# Patient Record
Sex: Female | Born: 1935 | Race: White | Hispanic: No
Health system: Midwestern US, Community
[De-identification: ages and names within clinical notes are randomized; demographics above are authoritative.]

---

## 2019-06-29 IMAGING — MR MRI LSPINE WO/W CONTRAST
9 series · 42 of 48 positions shown · IV contrast (prohance)
Comparison: None.

HISTORY: Low back pain.
TECHNIQUE: Multiplanar, multisequential MR images of the lumbar spine were obtained before and after administration of 15 mL of ProHance intravenous contrast.

[Series 11: iii_aaspine_lspine_mpr_cor · coronal · 1.7mm · 1.67mm/px · 8 of 80 slices shown]
[im 1/80]
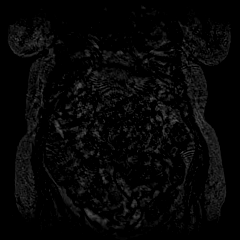
[im 13/80]
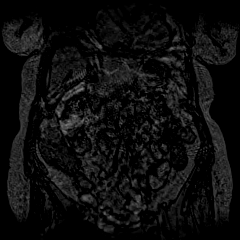
[im 25/80]
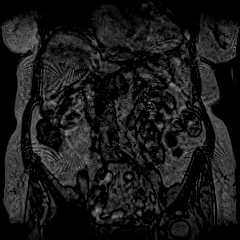
[im 37/80]
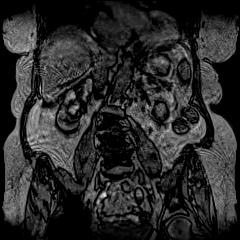
[im 43/80]
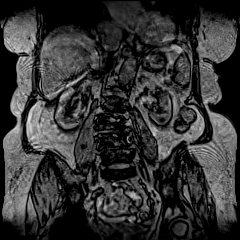
[im 55/80]
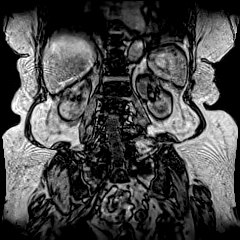
[im 67/80]
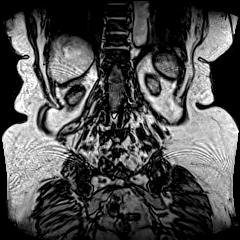
[im 80/80]
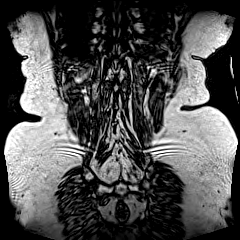

[Series 18: t2_sag · sagittal · 4.0mm · 0.68mm/px · 3 of 15 slices shown]
[im 1/15]
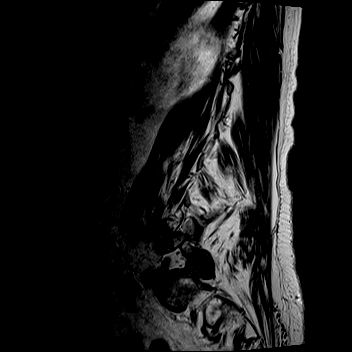
[im 8/15]
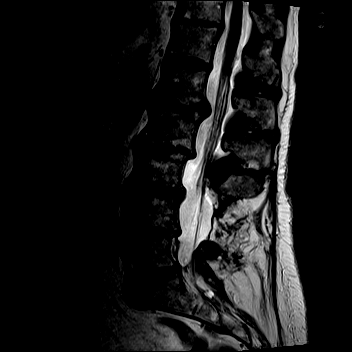
[im 15/15]
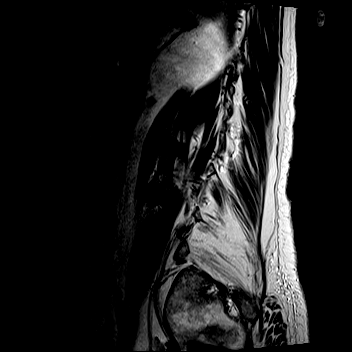

[Series 19: t1_sag · sagittal · 4.0mm · 0.75mm/px · 2 of 15 slices shown]
[im 1/15]
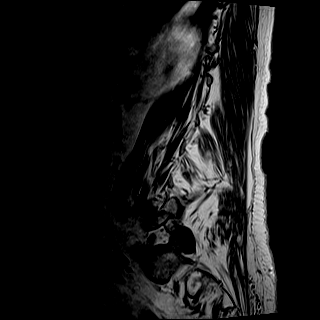
[im 15/15]
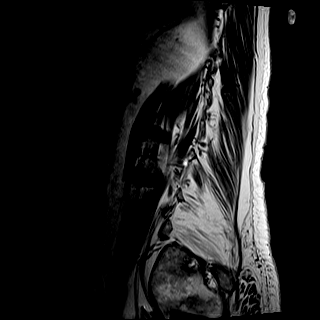

[Series 20: ir_sag · sagittal · 4.0mm · 0.94mm/px · 2 of 15 slices shown]
[im 1/15]
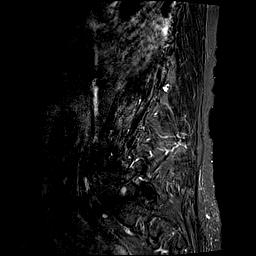
[im 15/15]
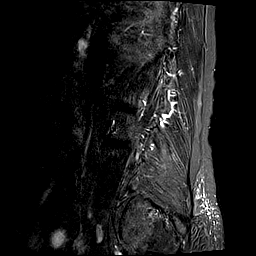

[Series 21: t2_axial · axial · 4.0mm · 0.62mm/px · z∈[-526,-329]mm · 7 of 42 slices shown]
[im 1/42]
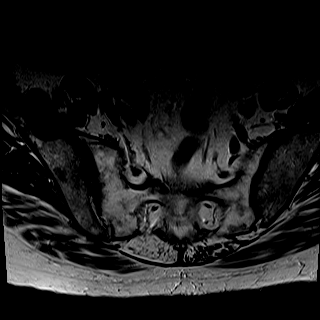
[im 7/42]
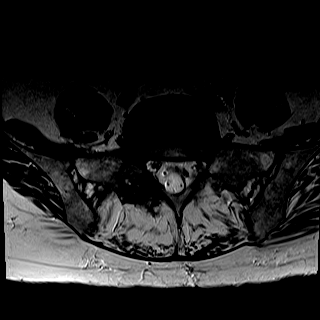
[im 14/42]
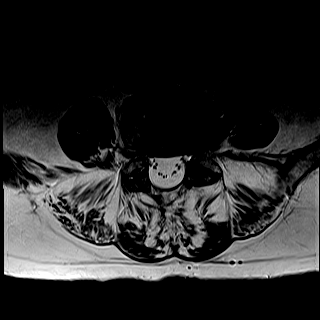
[im 21/42]
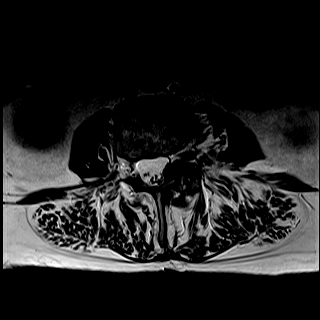
[im 28/42]
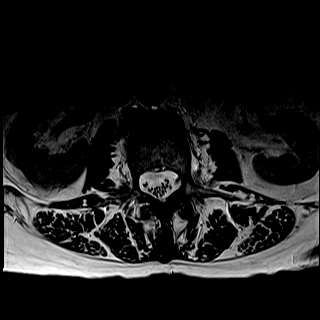
[im 35/42]
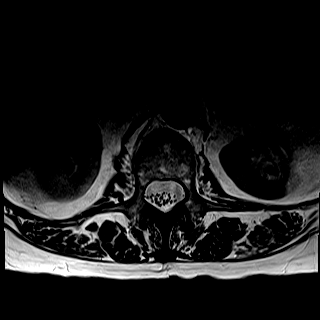
[im 42/42]
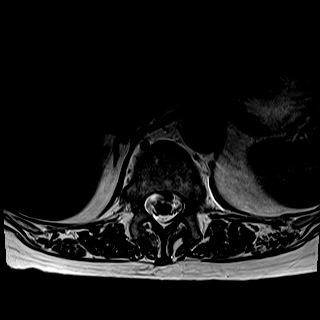

[Series 22: t1_axial_obl · axial · 3.0mm · 0.43mm/px · z∈[-556,-350]mm · 4 of 26 slices shown]
[im 1/26]
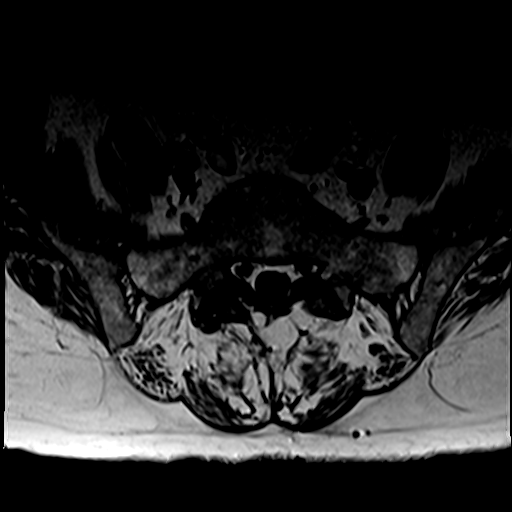
[im 9/26]
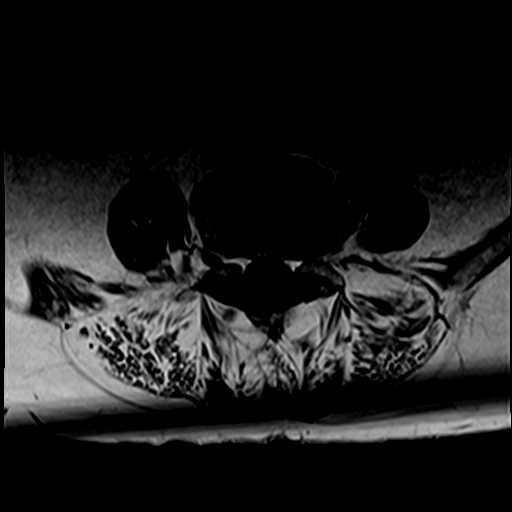
[im 17/26]
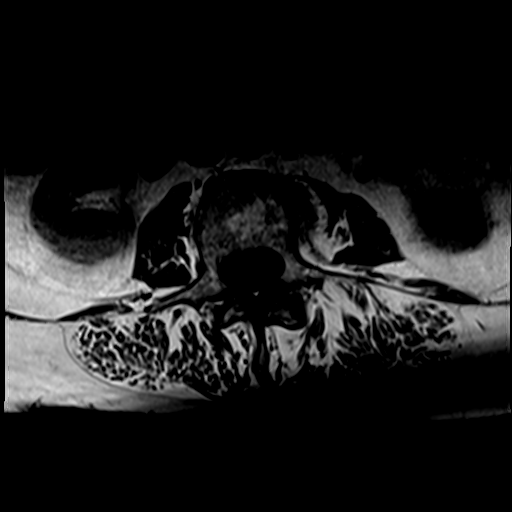
[im 26/26]
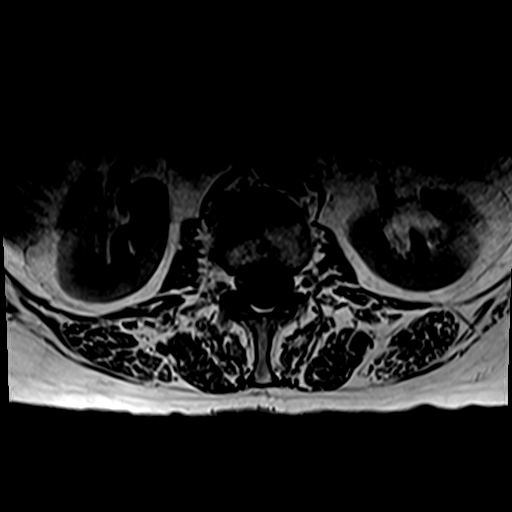

[Series 23: t1_axial_fs_pre · axial · 4.0mm · 0.78mm/px · z∈[-515,-328]mm · 7 of 40 slices shown]
[im 1/40]
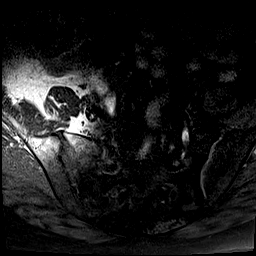
[im 7/40]
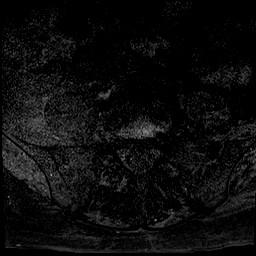
[im 14/40]
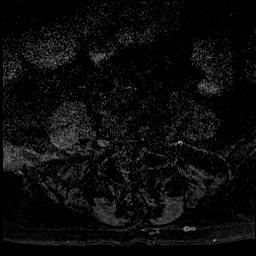
[im 20/40]
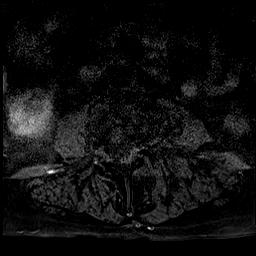
[im 27/40]
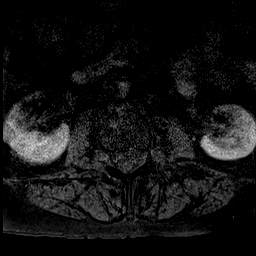
[im 33/40]
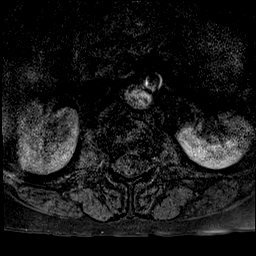
[im 40/40]
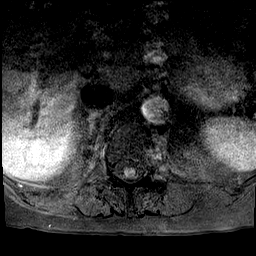

[Series 24: t1_axial_fs+c · axial · 4.0mm · 0.78mm/px · z∈[-515,-328]mm · 7 of 40 slices shown]
[im 1/40]
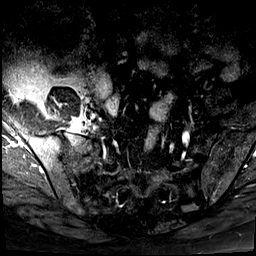
[im 7/40]
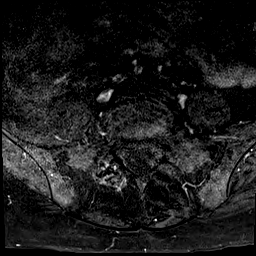
[im 14/40]
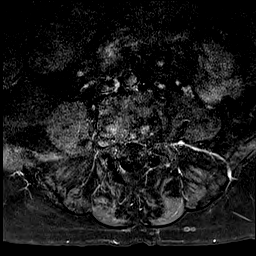
[im 20/40]
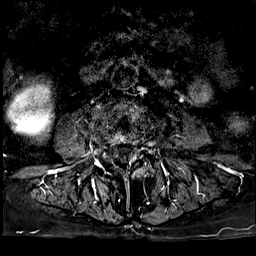
[im 27/40]
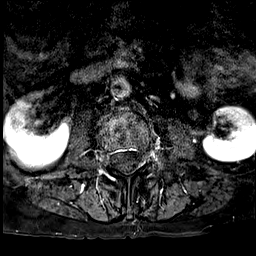
[im 33/40]
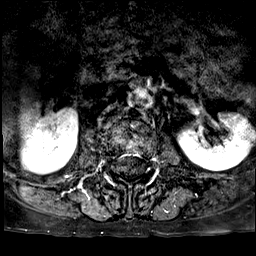
[im 40/40]
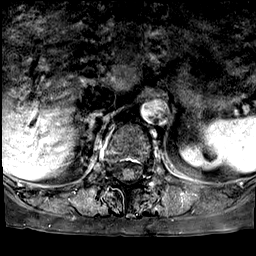

[Series 25: t1_sag_fs_+c · sagittal · 4.0mm · 0.94mm/px · 2 of 15 slices shown]
[im 1/15]
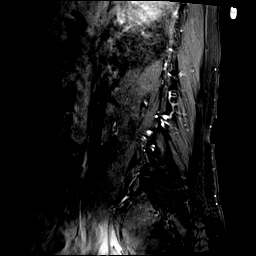
[im 15/15]
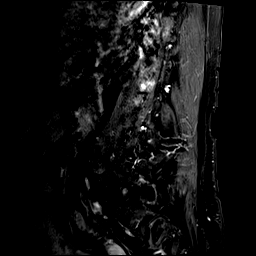

[42 of 48 positions shown; findings below may reference images not displayed]

FINDINGS: Five non-rib-bearing lumbar vertebrae are seen.  Curvature of lumbar spine to the right with apex at L3 is seen.  Mild to large endplate spurs of lumbar spine are seen. Degenerative endplate changes are seen. Endplate Schmorl's nodes are seen. Mild grade 1 retrolisthesis of L2 relative to L1 and L3 by 2 mm is seen. Previous L4 laminectomy is seen. No acute fracture is seen.

Decreased disc T2 signal of lumbar spine and lower thoracic spine with maintenance of disc height at the T12-L1 level and L5-S1 level seen. Moderate to severe disc space narrowing involving the rest of the lumbar spine and T11-T12 disc space is seen.

Conus medullaris terminates at the L1 level, and demonstrates normal signal. There is no abnormal thickening of the cauda equina nerve roots.

Enhancement due to degenerative endplate changes seen.

At T11-T12, disc bulge with endplate spur formation, as well as superimposed large central left paracentral disc extrusion with caudal migrating component identified. Mild degenerative facet disease and mild ligamentum flavum hypertrophy also seen. There is overall mild thecal sac compression and mild spinal canal stenosis. Neural foramina are patent.

At T12-L1, there is broad-based disc bulge, moderate degenerative facet disease with moderate ligamentum flavum hypertrophy, producing overall mild thecal sac compression and mild spinal canal stenosis. Mild left T12 neural foraminal stenosis due to degenerative facet disease identified. Right T12 neural foramen is patent.

At L1-L2, there is broad-based disc bulge with endplate spur formation, moderate degenerative facet disease and mild ligamentum flavum hypertrophy, producing overall mild to moderate thecal sac compression and mild to moderate spinal canal stenosis with also mild right L1 neural foraminal stenosis. Left L1 neural foramen is patent.

At L2-L3, there is broad-based disc bulge with endplate spur formation, moderate degenerative facet disease and moderate ligamentum flavum hypertrophy, producing overall mild to moderate thecal sac compression and mild to moderate spinal canal stenosis. Mild to moderate left L2 and mild right L2 neural foraminal stenosis identified.

At L3-L4, endplate spur formation identified. There is previous laminectomy with ligamentum flavum resection with minimal thecal sac compression on the left due to degenerative facet disease of moderate degree identified without spinal canal stenosis. Moderate left L3 neural foraminal stenosis due to endplate spur formation and degenerative facet disease identified. Right L3 neural foramen is patent. Enhancing granulation tissue adjacent to thecal sac and likely adjacent to the exiting L3 nerves identified.

At L4-L5, previous laminectomy with ligamentum flavum resection identified without disc herniation or spinal canal stenosis. Enhancing granulation tissue adjacent to thecal sac and adjacent to the exiting L4 nerve is seen. Moderate to severe left L4 and moderate right L4 neural foraminal stenosis due to endplate spur formation and degenerative facet disease identified.

At L5-S1, moderate degenerative facet disease on the right with mild degenerative facet disease on the left identified without disc herniation or spinal canal stenosis. Mild right L5 neural foraminal stenosis identified. Left L5 neural foramen is patent. Enhancing granulation tissue adjacent to the exiting L5 nerve is seen.

Likely Tarlov cyst at S2 level up to 17 mm is seen.

Degenerative changes of SI joints seen. Simple cyst at superior pole of left kidney is seen.
IMPRESSION: Degenerative changes and postsurgical changes of lumbar spine with curvature to the right with apex at L3 is seen.

Grade 1 retrolisthesis of L2 relative to L1 and L3 secondary to degenerative facet disease identified.

Bilateral mild to severe neural foraminal stenosis identified, most prominent involving left L4 neural foramen.

Mild to moderate spinal canal stenosis identified, most prominent at the L1-L2 level and L2-L3 level.

Enhancing granulation tissue adjacent to the thecal sac and adjacent to the bilateral exiting L3-L5 nerves identified.

No acute fracture is seen.

## 2019-10-28 IMAGING — CT CTA NECK WO-W CONTRAST
2 of 5 series · 11 of 33 positions shown · non-contrast
Comparison: None.

HISTORY: Carotid artery disease. Stent placement 07/06/2019.
TECHNIQUE: 5 mm axial images of CT neck study performed without contrast. After administration of 60 mL Dsovue-T33 IV contrast, 2 mm axial CT angiogram neck images are obtained. Coronal, sagittal, curved MPR and 3-D CT angiogram neck reformation images are obtained. Post-processing software generated rotating 3D MIP images, under concurrent physician supervision.

[Series 7: angio · axial · 0.36mm/px · z∈[-1078,-790]mm · 10 of 352 slices shown]
[im 32/352  soft-tissue]
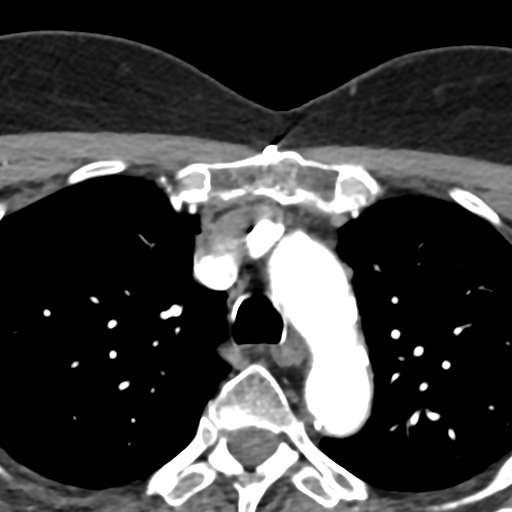
[im 64/352  bone]
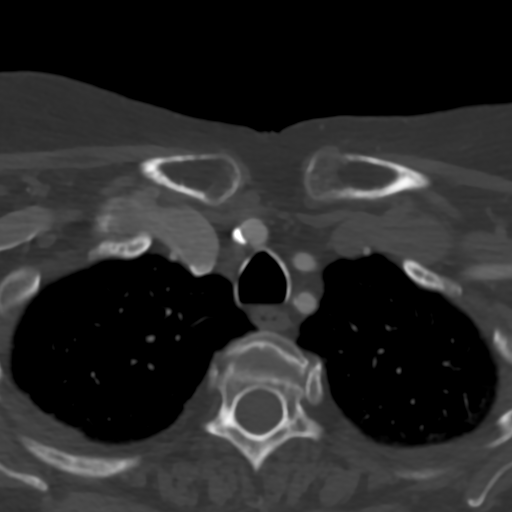
[im 96/352  soft-tissue]
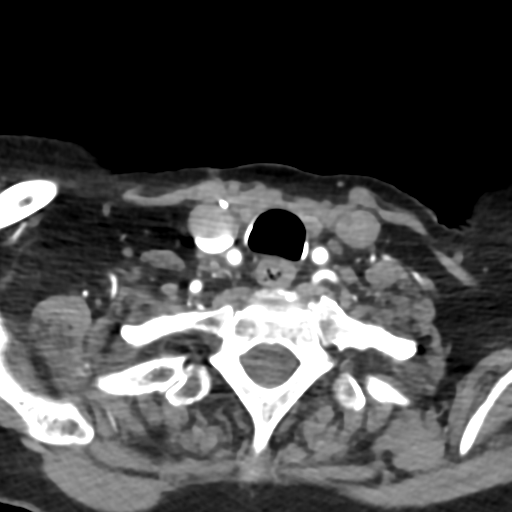
[im 128/352  bone]
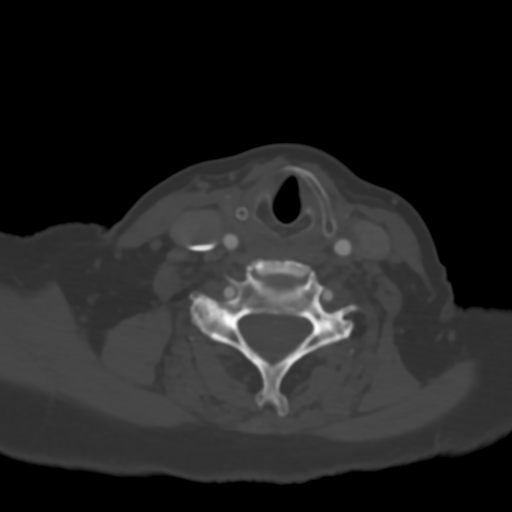
[im 160/352  soft-tissue]
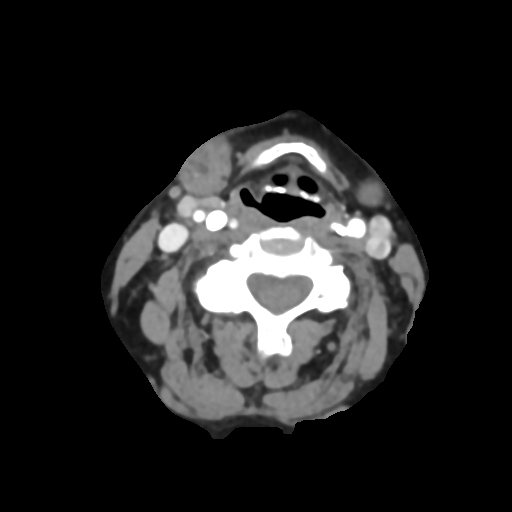
[im 192/352  bone]
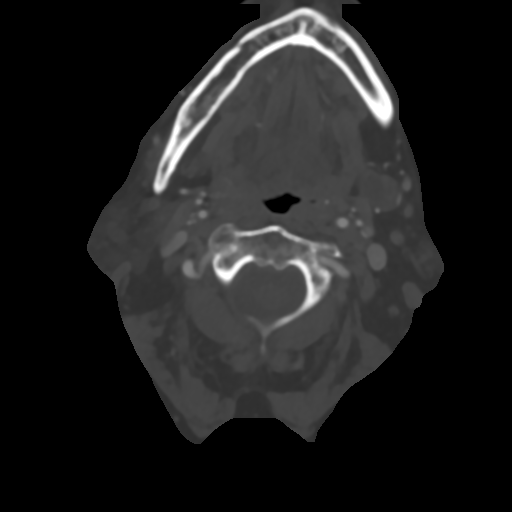
[im 224/352  soft-tissue]
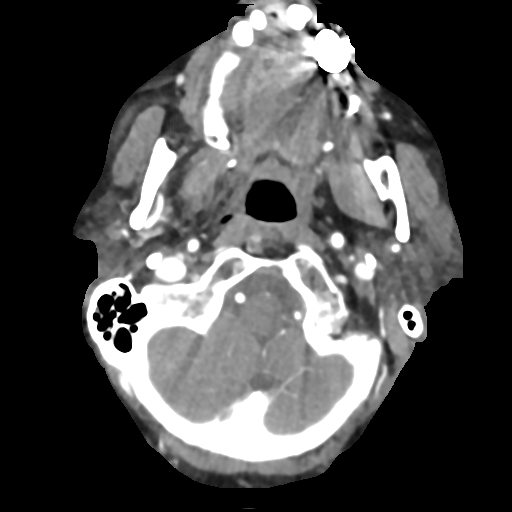
[im 256/352  bone]
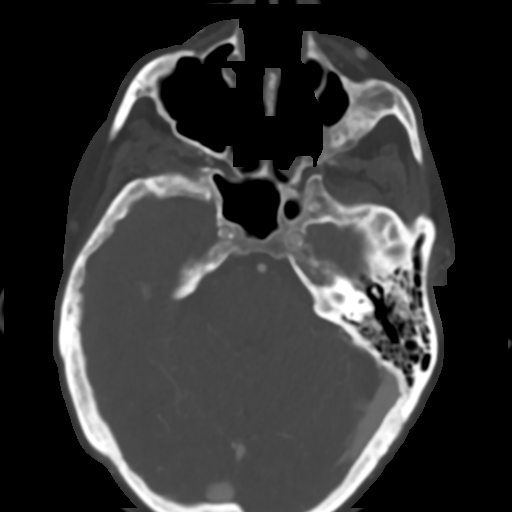
[im 288/352  soft-tissue]
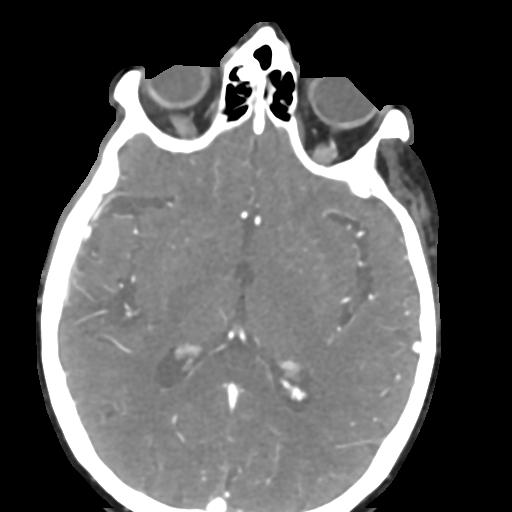
[im 320/352  bone]
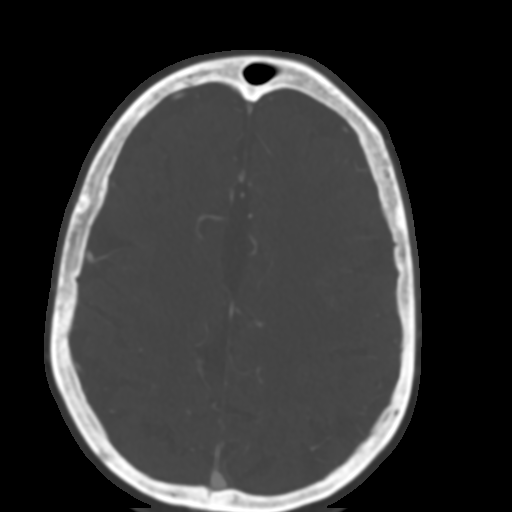

[Series 11: sagittal · sagittal · 0.33mm/px · 1 of 85 slices shown]
[im 43/85  soft-tissue]
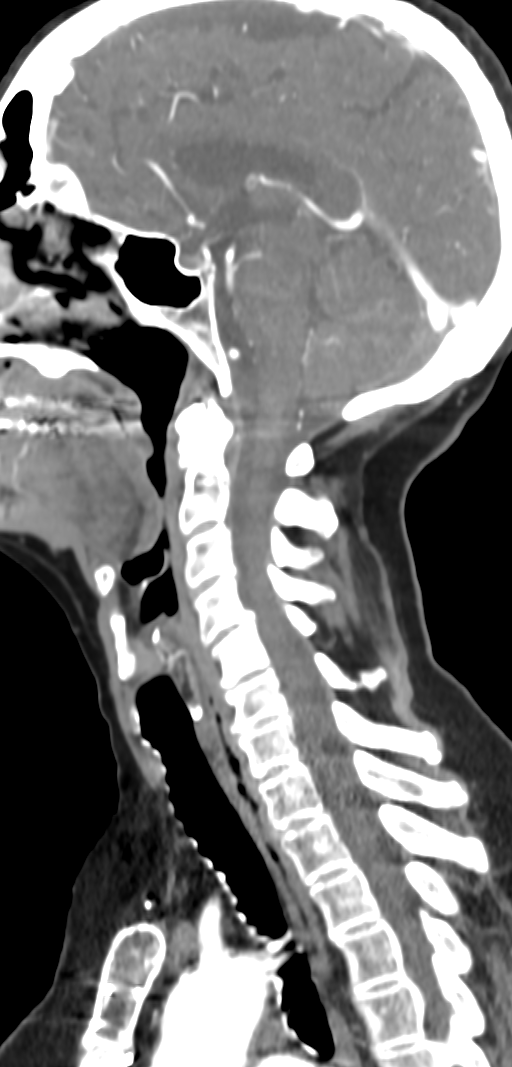

[11 of 33 positions shown; findings below may reference images not displayed]

FINDINGS: Calcified plaques of aortic arch are identified without aneurysm or dissection. Calcified plaques of brachiocephalic artery are identified producing 1 cm segment of narrowing of approximately 30% proximally. Calcified plaques of right subclavian artery identified producing narrowing of 20-30% proximally in middle portion. Distal right subclavian artery is normal caliber. Calcified plaques at proximal left subclavian artery identified producing narrowing of 20% identified without severe stenosis.

Right common carotid artery is of normal caliber. Stent placement from distal right common carotid through the right carotid bulb into proximal right internal carotid artery identified with 1 cm segment of 40-50% narrowing at the proximal right internal carotid artery identified. Right internal carotid artery shows tortuous course without other focal severe stenosis at cervical portion. Right external carotid artery is patent with suspected moderate narrowing at origin due to the stent placement. Calcified plaques of right carotid bulb identified.

Calcified plaques at origin of left common carotid artery with 5 mm segment of 30% narrowing identified without severe stenosis. Tortuosity of left common carotid artery identified without focal severe stenosis. Calcified plaques of left carotid bulb identified producing narrowing 30% identified without severe stenosis. Calcified plaques of proximal left internal carotid artery with short segment 30% narrowing also identified without severe stenosis. Tortuosity of left internal carotid artery identified without other focal severe stenosis at cervical portion. 2-3 mm segment of up to 75% stenosis at origin of left external carotid artery identified.

Tortuosity of proximal right vertebral artery identified without focal severe stenosis of right vertebral artery seen. Tortuosity at proximal left vertebral artery identified without focal severe stenosis of left vertebral artery identified.

Intrathecal vertebral arteries are patent. Distal left intrathecal vertebral artery is small in caliber relative to right. Basilar artery is normal caliber. Superior cerebellar arteries and posterior cerebral arteries are patent.

Bilateral petrous internal carotid arteries are patent. Calcified plaques involving bilateral cavernous and supraclinoid internal carotid arteries identified with tandem stenosis of 20-40% identified without focal severe stenosis.

Anterior communicating artery is patent. Right A1 segment is small in caliber relative to the left A1 segment. A2 segments are patent.

M1 and M2 branches of middle cerebral arteries are patent.

No aneurysm or arteriovenous malformation identified.

There is prominence of ventricles and cortical sulci. Periventricular and subcortical hypoattenuation foci are seen. Cervical spinal cord is grossly unremarkable.

Ciliary body calcifications of orbits identified. Previous bilateral cataract surgery is identified.

Mucosal thickening of ethmoid air cells, sphenoid sinuses and maxillary sinus with areas of wall sclerosis, most prominent involving left maxillary sinus is seen. Likely postsurgical changes of right maxillary sinus and right-sided ethmoid air cells are seen.

Mastoid air cells, middle ears and external auditory canals are normal.

Torus mandibularis identified. Streaky artifact from dental work is identified. Tongue and esophagus are grossly unremarkable.

Small calcification involving left-sided palatine tonsil is seen.

Nasopharynx, oropharynx, larynx and trachea are normal. Centrilobular-type of emphysematous changes identified. Dependent changes of upper lobes identified bilaterally. Questionable nodular subsegmental atelectasis of right lower lobe superior segment posteriorly up to 5 mm seen on image 7-3.

Noncalcified nodules involving superficial portion of parotid glands bilaterally with tubular appearance. Heterogeneous enhancement of submandibular glands with nodular areas identified. Thyroid gland is normal.

No other enlarged lymph node is identified.

Degenerative changes of cervical spine with mild grade 1 anterior spondylolisthesis of C4 relative to C3 and C5 by approximately 2 mm is seen.

Coronal, sagittal, curved MPR and 3-D CT angiogram neck reformation images confirm above findings.
IMPRESSION: Previous stent placement extending from distal right common carotid artery through the right carotid bulb into proximal right internal carotid artery with focal mild to moderate narrowing at the origin of right internal carotid artery identified.

Focal severe stenosis at origin of of left external carotid artery is seen.

Likely focal moderate narrowing at origin of right external carotid artery also identified.

Additional mild to moderate narrowing involving bilateral cavernous and supraclinoid internal carotid arteries, as well as the rest of the branches of aortic arch identified.

No occlusion is identified.

Questionable tubular noncalcified nodules involving thyroid gland identified bilaterally. Nodules of submandibular gland also identified bilaterally. Ultrasound exam of parotid glands and submandibular glands may provide more information.

Questionable nodule involving superior segment of right lower lobe is seen. Followup with CT chest study after 3-6 month interval is advised.

Additional chronic findings as detailed above.

Total radiation dose to patient is CTDIvol 20.27 mGy and DLP 478.18 mGy-cm.

## 2019-12-09 ENCOUNTER — Emergency Department: Admit: 2019-12-09 | Payer: MEDICARE

## 2019-12-09 ENCOUNTER — Inpatient Hospital Stay
Admit: 2019-12-09 | Discharge: 2019-12-10 | Disposition: A | Discharge status: Home / Self Care | Payer: MEDICARE | Attending: Emergency Medicine

## 2019-12-09 DIAGNOSIS — J189 Pneumonia, unspecified organism: Secondary | ICD-10-CM

## 2019-12-09 LAB — CBC WITH AUTO DIFFERENTIAL
Basophils %: 1 % (ref 0–2)
Basophils Absolute: 0.1 10*3/uL (ref 0.0–0.1)
Eosinophils %: 1 % (ref 0–5)
Eosinophils Absolute: 0.1 10*3/uL (ref 0.0–0.4)
Hematocrit: 37.7 % (ref 35.0–45.0)
Hemoglobin: 12.4 g/dL (ref 12.0–16.0)
Lymphocytes %: 17 % — ABNORMAL LOW (ref 21–52)
Lymphocytes Absolute: 1.8 10*3/uL (ref 0.9–3.6)
MCH: 28.7 PG (ref 24.0–34.0)
MCHC: 32.9 g/dL (ref 31.0–37.0)
MCV: 87.3 FL (ref 78.0–100.0)
MPV: 12.1 FL — ABNORMAL HIGH (ref 9.2–11.8)
Monocytes %: 22 % — ABNORMAL HIGH (ref 3–10)
Monocytes Absolute: 2.4 10*3/uL — ABNORMAL HIGH (ref 0.05–1.2)
Neutrophils %: 59 % (ref 40–73)
Neutrophils Absolute: 6.4 10*3/uL (ref 1.8–8.0)
Platelet Comment: ADEQUATE
Platelets: 218 10*3/uL (ref 135–420)
RBC: 4.32 M/uL (ref 4.20–5.30)
RDW: 15.2 % — ABNORMAL HIGH (ref 11.6–14.5)
WBC: 10.8 10*3/uL (ref 4.6–13.2)

## 2019-12-09 LAB — URINALYSIS W/ RFLX MICROSCOPIC
Bilirubin, Urine: NEGATIVE
Bilirubin: NEGATIVE
Blood, Urine: NEGATIVE
Blood: NEGATIVE
Glucose, Ur: NEGATIVE mg/dL
Glucose: NEGATIVE mg/dL
Ketone: NEGATIVE mg/dL
Ketones, Urine: NEGATIVE mg/dL
Leukocyte Esterase, Urine: NEGATIVE
Leukocyte Esterase: NEGATIVE
Nitrite, Urine: NEGATIVE
Nitrites: NEGATIVE
Protein, UA: NEGATIVE mg/dL
Protein: NEGATIVE mg/dL
Specific Gravity, UA: 1.013 (ref 1.005–1.030)
Specific gravity: 1.013 (ref 1.005–1.030)
Urobilinogen, UA, POCT: 1 EU/dL (ref 0.2–1.0)
Urobilinogen: 1 EU/dL (ref 0.2–1.0)
pH (UA): 8.5 — ABNORMAL HIGH (ref 5.0–8.0)
pH, UA: 8.5 — ABNORMAL HIGH (ref 5.0–8.0)

## 2019-12-09 LAB — COMPREHENSIVE METABOLIC PANEL
ALT: 29 U/L (ref 13–56)
AST: 33 U/L (ref 10–38)
Albumin/Globulin Ratio: 1.2 (ref 0.8–1.7)
Albumin: 3.9 g/dL (ref 3.4–5.0)
Alkaline Phosphatase: 58 U/L (ref 45–117)
Anion Gap: 9 mmol/L (ref 3.0–18)
BUN: 12 MG/DL (ref 7.0–18)
Bun/Cre Ratio: 16 (ref 12–20)
CO2: 24 mmol/L (ref 21–32)
Calcium: 8.6 MG/DL (ref 8.5–10.1)
Chloride: 106 mmol/L (ref 100–111)
Creatinine: 0.75 MG/DL (ref 0.6–1.3)
EGFR IF NonAfrican American: >60 mL/min/{1.73_m2} (ref >60)
GFR African American: >60 mL/min/{1.73_m2} (ref >60)
Globulin: 3.3 g/dL (ref 2.0–4.0)
Glucose: 103 mg/dL — ABNORMAL HIGH (ref 74–99)
Potassium: 4.3 mmol/L (ref 3.5–5.5)
Sodium: 139 mmol/L (ref 136–145)
Total Bilirubin: 1.3 MG/DL — ABNORMAL HIGH (ref 0.2–1.0)
Total Protein: 7.2 g/dL (ref 6.4–8.2)

## 2019-12-09 LAB — CARDIAC PANEL
CK-MB Index: 1 % (ref 0.0–4.0)
CK-MB: 1 ng/ml (ref <3.6)
Total CK: 101 U/L (ref 26–192)
Troponin I: <0.02 NG/ML (ref 0.0–0.045)

## 2019-12-09 LAB — COVID-19, RAPID: SARS-CoV-2, Rapid: NOT DETECTED

## 2019-12-09 LAB — PROBNP, N-TERMINAL: BNP: 1265 PG/ML (ref 0–1800)

## 2019-12-09 LAB — POCT LACTIC ACID: POC Lactic Acid: 1.02 mmol/L (ref 0.40–2.00)

## 2019-12-09 LAB — PROTIME-INR
INR: 1.1 (ref 0.8–1.2)
Protime: 13.8 s (ref 11.5–15.2)

## 2019-12-09 LAB — D-DIMER, QUANTITATIVE: D-Dimer, Quant: 1.89 ug/ml(FEU) — ABNORMAL HIGH (ref <0.46)

## 2019-12-09 LAB — CBC WITH AUTOMATED DIFF
ABS. BASOPHILS: 0.1 10*3/uL (ref 0.0–0.1)
ABS. EOSINOPHILS: 0.1 10*3/uL (ref 0.0–0.4)
ABS. LYMPHOCYTES: 1.8 10*3/uL (ref 0.9–3.6)
ABS. MONOCYTES: 2.4 10*3/uL — ABNORMAL HIGH (ref 0.05–1.2)
ABS. NEUTROPHILS: 6.4 10*3/uL (ref 1.8–8.0)
BASOPHILS: 1 % (ref 0–2)
EOSINOPHILS: 1 % (ref 0–5)
HCT: 37.7 % (ref 35.0–45.0)
HGB: 12.4 g/dL (ref 12.0–16.0)
LYMPHOCYTES: 17 % — ABNORMAL LOW (ref 21–52)
MCH: 28.7 PG (ref 24.0–34.0)
MCHC: 32.9 g/dL (ref 31.0–37.0)
MCV: 87.3 FL (ref 78.0–100.0)
MONOCYTES: 22 % — ABNORMAL HIGH (ref 3–10)
MPV: 12.1 FL — ABNORMAL HIGH (ref 9.2–11.8)
NEUTROPHILS: 59 % (ref 40–73)
PLATELET COMMENTS: ADEQUATE
PLATELET: 218 10*3/uL (ref 135–420)
RBC: 4.32 M/uL (ref 4.20–5.30)
RDW: 15.2 % — ABNORMAL HIGH (ref 11.6–14.5)
WBC: 10.8 10*3/uL (ref 4.6–13.2)

## 2019-12-09 LAB — POC LACTIC ACID: Lactic Acid (POC): 1.02 mmol/L (ref 0.40–2.00)

## 2019-12-09 LAB — METABOLIC PANEL, COMPREHENSIVE
A-G Ratio: 1.2 (ref 0.8–1.7)
ALT (SGPT): 29 U/L (ref 13–56)
AST (SGOT): 33 U/L (ref 10–38)
Albumin: 3.9 g/dL (ref 3.4–5.0)
Alk. phosphatase: 58 U/L (ref 45–117)
Anion gap: 9 mmol/L (ref 3.0–18)
BUN/Creatinine ratio: 16 (ref 12–20)
BUN: 12 MG/DL (ref 7.0–18)
Bilirubin, total: 1.3 MG/DL — ABNORMAL HIGH (ref 0.2–1.0)
CO2: 24 mmol/L (ref 21–32)
Calcium: 8.6 MG/DL (ref 8.5–10.1)
Chloride: 106 mmol/L (ref 100–111)
Creatinine: 0.75 MG/DL (ref 0.6–1.3)
GFR est AA: >60 mL/min/{1.73_m2} (ref >60)
GFR est non-AA: >60 mL/min/{1.73_m2} (ref >60)
Globulin: 3.3 g/dL (ref 2.0–4.0)
Glucose: 103 mg/dL — ABNORMAL HIGH (ref 74–99)
Potassium: 4.3 mmol/L (ref 3.5–5.5)
Protein, total: 7.2 g/dL (ref 6.4–8.2)
Sodium: 139 mmol/L (ref 136–145)

## 2019-12-09 LAB — NT-PRO BNP: NT pro-BNP: 1265 PG/ML (ref 0–1800)

## 2019-12-09 LAB — PROTHROMBIN TIME + INR
INR: 1.1 (ref 0.8–1.2)
Prothrombin time: 13.8 s (ref 11.5–15.2)

## 2019-12-09 LAB — D DIMER: D DIMER: 1.89 ug/ml(FEU) — ABNORMAL HIGH (ref <0.46)

## 2019-12-09 LAB — COVID-19 RAPID TEST: COVID-19 rapid test: NOT DETECTED

## 2019-12-09 LAB — CARDIAC PANEL,(CK, CKMB & TROPONIN)
CK - MB: 1 ng/ml (ref <3.6)
CK-MB Index: 1 % (ref 0.0–4.0)
CK: 101 U/L (ref 26–192)
Troponin-I, QT: <0.02 NG/ML (ref 0.0–0.045)

## 2019-12-09 MED ORDER — SODIUM CHLORIDE 0.9% BOLUS IV
0.9 % | Freq: Once | INTRAVENOUS | Status: AC
Start: 2019-12-09 — End: 2019-12-09
  Administered 2019-12-09: 22:00:00 via INTRAVENOUS

## 2019-12-09 MED ORDER — IPRATROPIUM-ALBUTEROL 2.5 MG-0.5 MG/3 ML NEB SOLUTION
2.5 mg-0.5 mg/3 ml | Freq: Once | RESPIRATORY_TRACT | Status: AC
Start: 2019-12-09 — End: 2019-12-09
  Administered 2019-12-09: 23:00:00 via RESPIRATORY_TRACT

## 2019-12-09 MED ORDER — ACETAMINOPHEN 500 MG TAB
500 mg | ORAL | Status: AC
Start: 2019-12-09 — End: 2019-12-09
  Administered 2019-12-09: 22:00:00 via ORAL

## 2019-12-09 MED ORDER — IOPAMIDOL 61 % IV SOLN
61 % | Freq: Once | INTRAVENOUS | Status: AC
Start: 2019-12-09 — End: 2019-12-09
  Administered 2019-12-10: 01:00:00 via INTRAVENOUS

## 2019-12-09 MED ORDER — SODIUM CHLORIDE 0.9 % IV
500 mg | INTRAVENOUS | Status: DC
Start: 2019-12-09 — End: 2019-12-10
  Administered 2019-12-09: 22:00:00 via INTRAVENOUS

## 2019-12-09 MED ORDER — SODIUM CHLORIDE 0.9 % IJ SYRG
INTRAMUSCULAR | Status: DC | PRN
Start: 2019-12-09 — End: 2019-12-10

## 2019-12-09 MED ORDER — WATER FOR INJECTION, STERILE INJECTION
2 gram | INTRAMUSCULAR | Status: DC
Start: 2019-12-09 — End: 2019-12-10
  Administered 2019-12-09: 22:00:00 via INTRAVENOUS

## 2019-12-09 MED FILL — SODIUM CHLORIDE 0.9 % IV: INTRAVENOUS | Qty: 1000

## 2019-12-09 MED FILL — AZITHROMYCIN 500 MG IV SOLUTION: 500 mg | INTRAVENOUS | Qty: 5

## 2019-12-09 MED FILL — ISOVUE-300  61 % INTRAVENOUS SOLUTION: 300 mg iodine /mL (61 %) | INTRAVENOUS | Qty: 100

## 2019-12-09 MED FILL — CEFTRIAXONE 2 GRAM SOLUTION FOR INJECTION: 2 gram | INTRAMUSCULAR | Qty: 2

## 2019-12-09 MED FILL — IPRATROPIUM-ALBUTEROL 2.5 MG-0.5 MG/3 ML NEB SOLUTION: 2.5 mg-0.5 mg/3 ml | RESPIRATORY_TRACT | Qty: 3

## 2019-12-09 MED FILL — SODIUM CHLORIDE 0.9 % IV: INTRAVENOUS | Qty: 100

## 2019-12-09 MED FILL — ACETAMINOPHEN 500 MG TAB: 500 mg | ORAL | Qty: 2

## 2019-12-09 MED FILL — BD POSIFLUSH NORMAL SALINE 0.9 % INJECTION SYRINGE: INTRAMUSCULAR | Qty: 10

## 2019-12-09 NOTE — ED Notes (Signed)
Pt. Resting in bed with daughter bedside.

## 2019-12-09 NOTE — ED Notes (Signed)
Pt. @ RAD @ this time.

## 2019-12-09 NOTE — ED Notes (Signed)
Pt. Greeted/made comfortable in bed @ this time. Pt. Understands to contact RN with any concerns/requests/needs.

## 2019-12-09 NOTE — ED Notes (Signed)
 Pt. C/o of abdomen feeling tight. Provider made aware. Daughter bedside.

## 2019-12-09 NOTE — ED Provider Notes (Signed)
EMERGENCY DEPARTMENT HISTORY AND PHYSICAL EXAM    5:26 PM      Date: 12/09/2019  Patient Name: Suzanne Castro    History of Presenting Illness     Chief Complaint   Patient presents with   ??? Shortness of Breath         History Provided By: Patient  Location/Duration/Severity/Modifying factors   Patient is an 84 year old female with a history of severe disease with carotid stent on Plavix, atrial fibrillation previously on anticoagulation, COPD, hypertension, dyslipidemia, the presents emergency department with complaint of 2 days of increasing fatigue and shortness of breath.  Patient said this is happened 3 times in the past and each time is been related to severe anemia.  This times patient says she was on Eliquis which she has not been stopped since the last time she was admitted to the hospital in May.  Patient's been traveling to visit family for the last month and lives in [place].  The patient has all of her doctors there however is here visiting family.  The patient says when she has these episodes she does develop a fever and has been feeling some shortness of breath however has had no sick contacts and is fully Covid vaccinated.  Patient denies any diarrhea, vomiting, or change in appetite.  Patient denies any urinary symptoms.  Patient denies any chest pain.  Patient denies any other aggravating or alleviating factors.  Patient is non-smoker, drinker, or drug user.  Patient is no longer working.          PCP: None    Current Facility-Administered Medications   Medication Dose Route Frequency Provider Last Rate Last Admin   ??? sodium chloride (NS) flush 5-10 mL  5-10 mL IntraVENous PRN Nuala Alpha, PA       ??? cefTRIAXone (ROCEPHIN) 2 g in sterile water (preservative free) 20 mL IV syringe  2 g IntraVENous Q24H Nuala Alpha, PA   2 g at 12/09/19 1800   ??? azithromycin (ZITHROMAX) 500 mg in 0.9% sodium chloride 250 mL (VIAL-MATE)  500 mg IntraVENous Q24H Nuala Alpha, [place] 250 mL/hr at  12/09/19 1807 500 mg at 12/09/19 1807       Past History     Past Medical History:  Past Medical History:   Diagnosis Date   ??? Arthritis    ??? COPD (chronic obstructive pulmonary disease) ([place])    ??? Emphysema of lung ([place])    ??? Hypertension    ??? Sleep apnea        Past Surgical History:  Past Surgical History:   Procedure Laterality Date   ??? HX CAROTID ENDARTERECTOMY     ??? HX CAROTID STENT     ??? HX CORONARY STENT PLACEMENT     ??? HX GI      celiac stent placed       Family History:  History reviewed. No pertinent family history.    Social History:  Social History     Tobacco Use   ??? Smoking status: Former Smoker   ??? Smokeless tobacco: Never Used   Substance Use Topics   ??? Alcohol use: Yes   ??? Drug use: Never       Allergies:  Allergies   Allergen Reactions   ??? Morphine Other (comments)     combative         Review of Systems       Review of Systems   Constitutional: Positive for activity change, fatigue and fever.  HENT: Negative for congestion and rhinorrhea.    Eyes: Negative for visual disturbance.   Respiratory: Positive for shortness of breath.    Cardiovascular: Negative for chest pain and palpitations.   Gastrointestinal: Negative for abdominal pain, diarrhea, nausea and vomiting.   Genitourinary: Negative for dysuria and hematuria.   Musculoskeletal: Negative for back pain.   Skin: Negative for rash.   Neurological: Negative for dizziness, weakness and light-headedness.   All other systems reviewed and are negative.        Physical Exam     Visit Vitals  BP (!) 146/71   Pulse 94   Temp (!) 101.8 ??F (38.8 ??C)   Resp 20   Ht '5\' 5"'  (1.651 m)   Wt 68.5 kg (151 lb)   SpO2 95%   BMI 25.13 kg/m??         Physical Exam  Vitals and nursing note reviewed.   Constitutional:       General: She is not in acute distress.     Appearance: She is well-developed.      Comments: Globally weak, conversational dyspnea   HENT:      Head: Normocephalic and atraumatic.      Right Ear: External ear normal.      Left Ear: External ear  normal.      Nose: Nose normal.   Eyes:      General: No scleral icterus.     Conjunctiva/sclera: Conjunctivae normal.      Pupils: Pupils are equal, round, and reactive to light.   Neck:      Thyroid: No thyromegaly.      Vascular: No JVD.      Trachea: No tracheal deviation.   Cardiovascular:      Rate and Rhythm: Regular rhythm. Tachycardia present.      Heart sounds: Normal heart sounds. No murmur heard.   No friction rub. No gallop.    Pulmonary:      Effort: Pulmonary effort is normal.      Breath sounds: Rhonchi present.   Chest:      Chest wall: No tenderness.   Abdominal:      General: Bowel sounds are normal. There is no distension.      Palpations: Abdomen is soft.      Tenderness: There is no abdominal tenderness. There is no guarding or rebound.   Musculoskeletal:         General: No tenderness. Normal range of motion.      Cervical back: Normal range of motion and neck supple.   Lymphadenopathy:      Cervical: No cervical adenopathy.   Skin:     General: Skin is warm and dry.   Neurological:      Mental Status: She is alert and oriented to person, place, and time.      Cranial Nerves: No cranial nerve deficit.      Coordination: Coordination normal.      Comments: Mild global weakness, gait steady   Psychiatric:         Behavior: Behavior normal.         Thought Content: Thought content normal.         Judgment: Judgment normal.           Diagnostic Study Results     Labs -  Recent Results (from the past 12 hour(s))   EKG, 12 LEAD, INITIAL    Collection Time: 12/09/19  4:24 PM   Result Value Ref Range  Ventricular Rate 99 BPM    Atrial Rate 99 BPM    P-R Interval 218 ms    QRS Duration 88 ms    Q-T Interval 356 ms    QTC Calculation (Bezet) 456 ms    Calculated P Axis 91 degrees    Calculated R Axis -35 degrees    Calculated T Axis 90 degrees    Diagnosis       Sinus rhythm with 1st degree AV block  Left axis deviation  Nonspecific ST and T wave abnormality  Abnormal ECG  No previous ECGs available      COVID-19 RAPID TEST    Collection Time: 12/09/19  4:55 PM   Result Value Ref Range    Specimen source SWAB      COVID-19 rapid test Not detected NOTD     POC LACTIC ACID    Collection Time: 12/09/19  5:19 PM   Result Value Ref Range    Lactic Acid (POC) 1.02 0.40 - 2.00 mmol/L   CBC WITH AUTOMATED DIFF    Collection Time: 12/09/19  5:34 PM   Result Value Ref Range    WBC 10.8 4.6 - 13.2 K/uL    RBC 4.32 4.20 - 5.30 M/uL    HGB 12.4 12.0 - 16.0 g/dL    HCT 37.7 35.0 - 45.0 %    MCV 87.3 78.0 - 100.0 FL    MCH 28.7 24.0 - 34.0 PG    MCHC 32.9 31.0 - 37.0 g/dL    RDW 15.2 (H) 11.6 - 14.5 %    PLATELET 218 135 - 420 K/uL    MPV 12.1 (H) 9.2 - 11.8 FL    NEUTROPHILS 59 40 - 73 %    LYMPHOCYTES 17 (L) 21 - 52 %    MONOCYTES 22 (H) 3 - 10 %    EOSINOPHILS 1 0 - 5 %    BASOPHILS 1 0 - 2 %    ABS. NEUTROPHILS 6.4 1.8 - 8.0 K/UL    ABS. LYMPHOCYTES 1.8 0.9 - 3.6 K/UL    ABS. MONOCYTES 2.4 (H) 0.05 - 1.2 K/UL    ABS. EOSINOPHILS 0.1 0.0 - 0.4 K/UL    ABS. BASOPHILS 0.1 0.0 - 0.1 K/UL    DF MANUAL      PLATELET COMMENTS ADEQUATE PLATELETS      RBC COMMENTS NORMOCYTIC, NORMOCHROMIC     METABOLIC PANEL, COMPREHENSIVE    Collection Time: 12/09/19  5:34 PM   Result Value Ref Range    Sodium 139 136 - 145 mmol/L    Potassium 4.3 3.5 - 5.5 mmol/L    Chloride 106 100 - 111 mmol/L    CO2 24 21 - 32 mmol/L    Anion gap 9 3.0 - 18 mmol/L    Glucose 103 (H) 74 - 99 mg/dL    BUN 12 7.0 - 18 MG/DL    Creatinine 0.75 0.6 - 1.3 MG/DL    BUN/Creatinine ratio 16 12 - 20      GFR est AA >60 >60 ml/min/1.31m    GFR est non-AA >60 >60 ml/min/1.723m   Calcium 8.6 8.5 - 10.1 MG/DL    Bilirubin, total 1.3 (H) 0.2 - 1.0 MG/DL    ALT (SGPT) 29 13 - 56 U/L    AST (SGOT) 33 10 - 38 U/L    Alk. phosphatase 58 45 - 117 U/L    Protein, total 7.2 6.4 - 8.2 g/dL    Albumin 3.9 3.4 - 5.0  g/dL    Globulin 3.3 2.0 - 4.0 g/dL    A-G Ratio 1.2 0.8 - 1.7     CARDIAC PANEL,(CK, CKMB & TROPONIN)    Collection Time: 12/09/19  5:34 PM   Result Value Ref Range    CK  - MB 1.0 <3.6 ng/ml    CK-MB Index 1.0 0.0 - 4.0 %    CK 101 26 - 192 U/L    Troponin-I, QT <0.02 0.0 - 0.045 NG/ML   NT-PRO BNP    Collection Time: 12/09/19  5:34 PM   Result Value Ref Range    NT pro-BNP 1,265 0 - 1,800 PG/ML   PROTHROMBIN TIME + INR    Collection Time: 12/09/19  5:34 PM   Result Value Ref Range    Prothrombin time 13.8 11.5 - 15.2 sec    INR 1.1 0.8 - 1.2     URINALYSIS W/ RFLX MICROSCOPIC    Collection Time: 12/09/19  5:34 PM   Result Value Ref Range    Color YELLOW      Appearance CLEAR      Specific gravity 1.013 1.005 - 1.030      pH (UA) 8.5 (H) 5.0 - 8.0      Protein Negative NEG mg/dL    Glucose Negative NEG mg/dL    Ketone Negative NEG mg/dL    Bilirubin Negative NEG      Blood Negative NEG      Urobilinogen 1.0 0.2 - 1.0 EU/dL    Nitrites Negative NEG      Leukocyte Esterase Negative NEG     D DIMER    Collection Time: 12/09/19  5:42 PM   Result Value Ref Range    D DIMER 1.89 (H) <0.46 ug/ml(FEU)       Radiologic Studies -   XR CHEST PORT    (Results Pending)   CTA CHEST W OR W WO CONT    (Results Pending)   CT ABD PELV W CONT    (Results Pending)   Hyperinflated, history of sternotomy, left lower lobe infiltrate, mild right lower lobe airspace disease Sallye Ober, DO 5:53 PM        Medical Decision Making   I am the first provider for this patient.    I reviewed the vital signs, available nursing notes, past medical history, past surgical history, family history and social history.    Vital Signs-Reviewed the patient's vital signs.      EKG: Sinus rhythm 99, first-degree AV block, no STEMI, interpreted by me    Records Reviewed: Nursing Notes, Old Medical Records, Previous Radiology Studies and Previous Laboratory Studies (Time of Review: 5:26 PM)    ED Course: Progress Notes, Reevaluation, and Consults:     The patient's rapid Covid is reassuring and the patient has a reassuring hemoglobin.  I suspect the patient has a left lower lobe pneumonia as a cause of her fever.   Patient's been given IV antibiotics and awaiting lactic acid.Sallye Ober, DO 6:46 PM    I went to reevaluate the patient and found the patient with some tachycardia and now abdominal pain.  The patient is febrile with normal lactic acid but now is abdominal pain and concern for a left lower lobe pneumonia.  I added on a D-dimer which is elevated so we will proceed with a CT of the chest and a CT of the abdomen to help elucidate the cause of her symptoms.  I signed out the case to Dr. Owens Shark  to follow the imaging results and reevaluate the patient.    7:19 PM : Pt care transferred to Dr. Owens Shark  ,ED provider. History of patient complaint(s), available diagnostic reports and current treatment plan has been discussed thoroughly.   Bedside rounding on patient occured : yes .  Intended disposition of patient : TBD  Pending diagnostics reports and/or labs (please list): CT AP, CTA Chest     Dr. Saul Fordyce assistance in completion of this plan is greatly appreciated but it should be noted that I will be the provider of record for this patient.        Provider Notes (Medical Decision Making):   MDM  Number of Diagnoses or Management Options  Diagnosis management comments: Patient is an 84 year old female with a history of vascular disease with multiple surgical interventions including recent carotid stent on Plavix, paroxysmal atrial fibrillation with history of anticoagulation use now stopped due to episodic anemia, recent admissions for anemia with fever, the presents emergency department with complaint of fever and fatigue with concerns that her blood counts have dropped.  The patient says that she is had no bleeding however says this has been happening in the past and last time it happened in May.  Patient's mental status is normal and differential concerns include infectious etiology including COVID-19, pneumonia, as well as hematologic conditions.  We will follow patient's hemoglobin, empiric antibiotics, chest  x-ray, rapid Covid, hydrate as tolerated, and reevaluate.Sallye Ober, DO 5:30 PM        Procedures      Diagnosis     Clinical Impression:   1. Fever, unspecified fever cause    2. Abdominal pain, generalized        Disposition:       Patient's Medications    No medications on file     Disclaimer: Sections of this note are dictated using utilizing voice recognition software.  Minor typographical errors may be present. If questions arise, please do not hesitate to contact me or call our department.      Re-assess patient prior to D/C.  Patient is to F/U with PCP or clinid in 2 days.  Rx: Augmentin and Zithromax.  Return to ER prn prn or SOB occurs.  Tylenol or motrin for fever.        [place] [facility], MD

## 2019-12-09 NOTE — ED Notes (Signed)
Frequent rounding on patient; no new complaints, changes, or physical findings noted. Patient updated on which test results are still pending.  Encouraged to voice any concerns. All questions/concerns addressed.  Offered comfort measures; warm blanket, reposition, dimmed lights.  Call bell remains at bedside. Hourly rounding for comfort and pain control remains in progress. Daughter @ bedside.

## 2019-12-09 NOTE — ED Notes (Signed)
Patient c/o shortness of breath x 2 days.  She states symptoms are similar to when her hemoglobin bottomed out.  She states hx of COPD.  Denies any recent illness or chest pain.

## 2019-12-09 NOTE — ED Notes (Signed)
Pt. Resting in bed with meds. Infusing. Pt. Tolerating well.

## 2019-12-09 NOTE — ED Notes (Deleted)
84 yo F with hx of COPD, CAD, atrial fibrillation with dyspnea.     Sepsis bundle initiated.     I performed a brief evaluation, including history and physical, of the patient here in triage and I have determined that pt will need further treatment and evaluation from the main side ER physician.  I have placed initial orders to help in expediting patients care.     December 09, 2019 at 4:47 PM - Silvio Pate, PA        Visit Vitals  BP (!) 177/78 (BP 1 Location: Left upper arm, BP Patient Position: At rest)   Pulse 94   Temp (!) 101.8 F (38.8 C)   Resp 20   Ht 5\' 5"  (1.651 m)   Wt 68.5 kg (151 lb)   SpO2 94%   BMI 25.13 kg/m

## 2019-12-09 NOTE — ED Notes (Signed)
Pt. States she is feeling much better. Daughter is bedside. Report to [facility].

## 2019-12-10 LAB — EKG 12-LEAD
Atrial Rate: 99 {beats}/min
P Axis: 91 degrees
P-R Interval: 218 ms
Q-T Interval: 356 ms
QRS Duration: 88 ms
QTc Calculation (Bazett): 456 ms
R Axis: -35 degrees
T Axis: 90 degrees
Ventricular Rate: 99 {beats}/min

## 2019-12-10 LAB — TYPE AND SCREEN
ABO/Rh: B POS
Antibody Screen: NEGATIVE

## 2019-12-10 LAB — EKG, 12 LEAD, INITIAL
Atrial Rate: 99 {beats}/min
Calculated P Axis: 91 degrees
Calculated R Axis: -35 degrees
Calculated T Axis: 90 degrees
P-R Interval: 218 ms
Q-T Interval: 356 ms
QRS Duration: 88 ms
QTC Calculation (Bezet): 456 ms
Ventricular Rate: 99 {beats}/min

## 2019-12-10 LAB — TYPE & SCREEN
ABO/Rh(D): B POS
Antibody screen: NEGATIVE

## 2019-12-10 MED ORDER — AZITHROMYCIN 250 MG TAB
250 mg | ORAL_TABLET | ORAL | 0 refills | Status: AC
Start: 2019-12-10 — End: ?

## 2019-12-10 MED ORDER — AMOXICILLIN CLAVULANATE 875 MG-125 MG TAB
875-125 mg | ORAL_TABLET | Freq: Two times a day (BID) | ORAL | 0 refills | Status: AC
Start: 2019-12-10 — End: 2019-12-19

## 2019-12-10 NOTE — ED Notes (Signed)
I have reviewed discharge instructions with the patient.  The patient verbalized understanding. Patient looks comfortable, left ED in stable. Patient wheeled to family vehicle, no acute distress noted.

## 2019-12-15 LAB — CULTURE, BLOOD 1
Culture: NO GROWTH
Culture: NO GROWTH

## 2019-12-15 LAB — CULTURE, BLOOD
Culture result:: NO GROWTH
Culture result:: NO GROWTH

## 2020-03-17 IMAGING — CR CHEST 2 VWS PA LAT
1 series · 2 of 2 positions shown · non-contrast
Comparison: Nothing of chest.

HISTORY: Shortness of breath. 84-year-old female.
TECHNIQUE: Chest x-ray 2 view.

[Series 1: pa · 0.17mm/px · 2 of 2 slices shown]
[im 1/2]
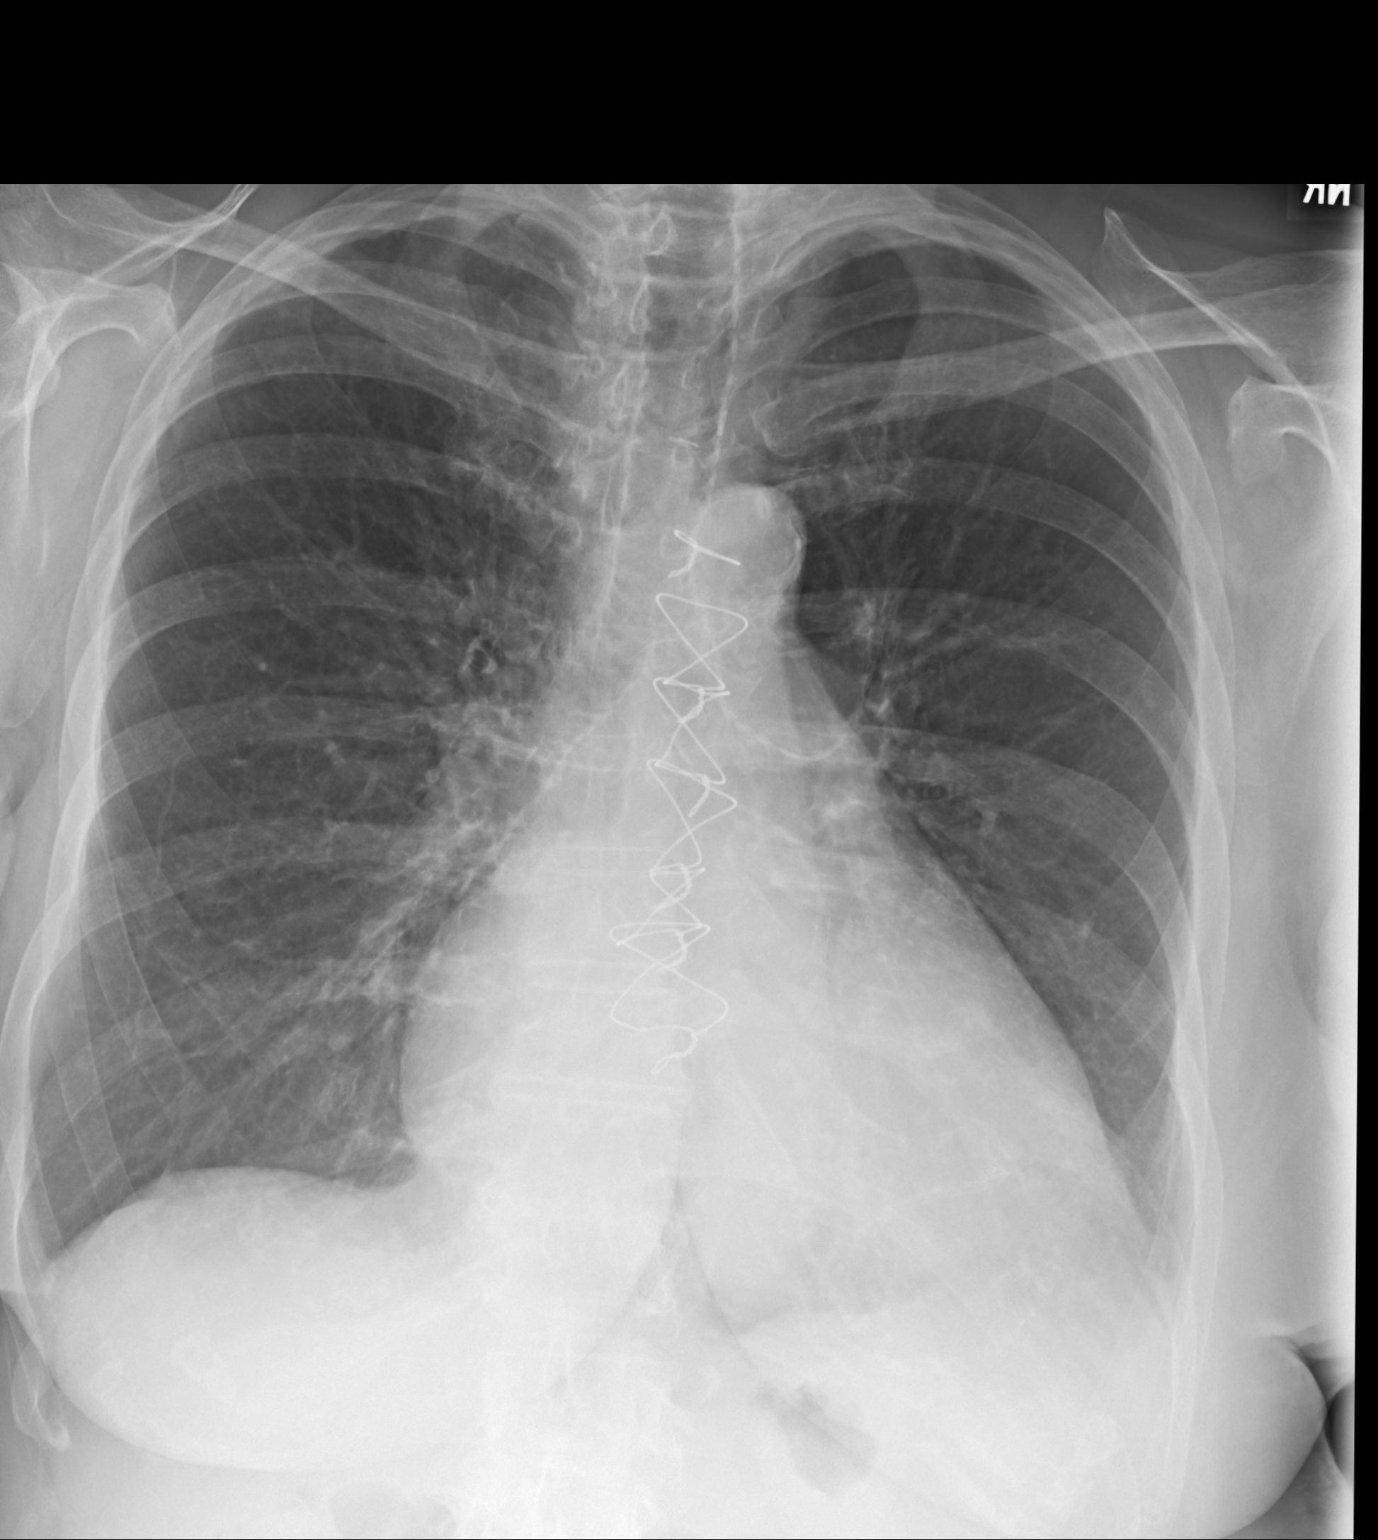
[im 2/2]
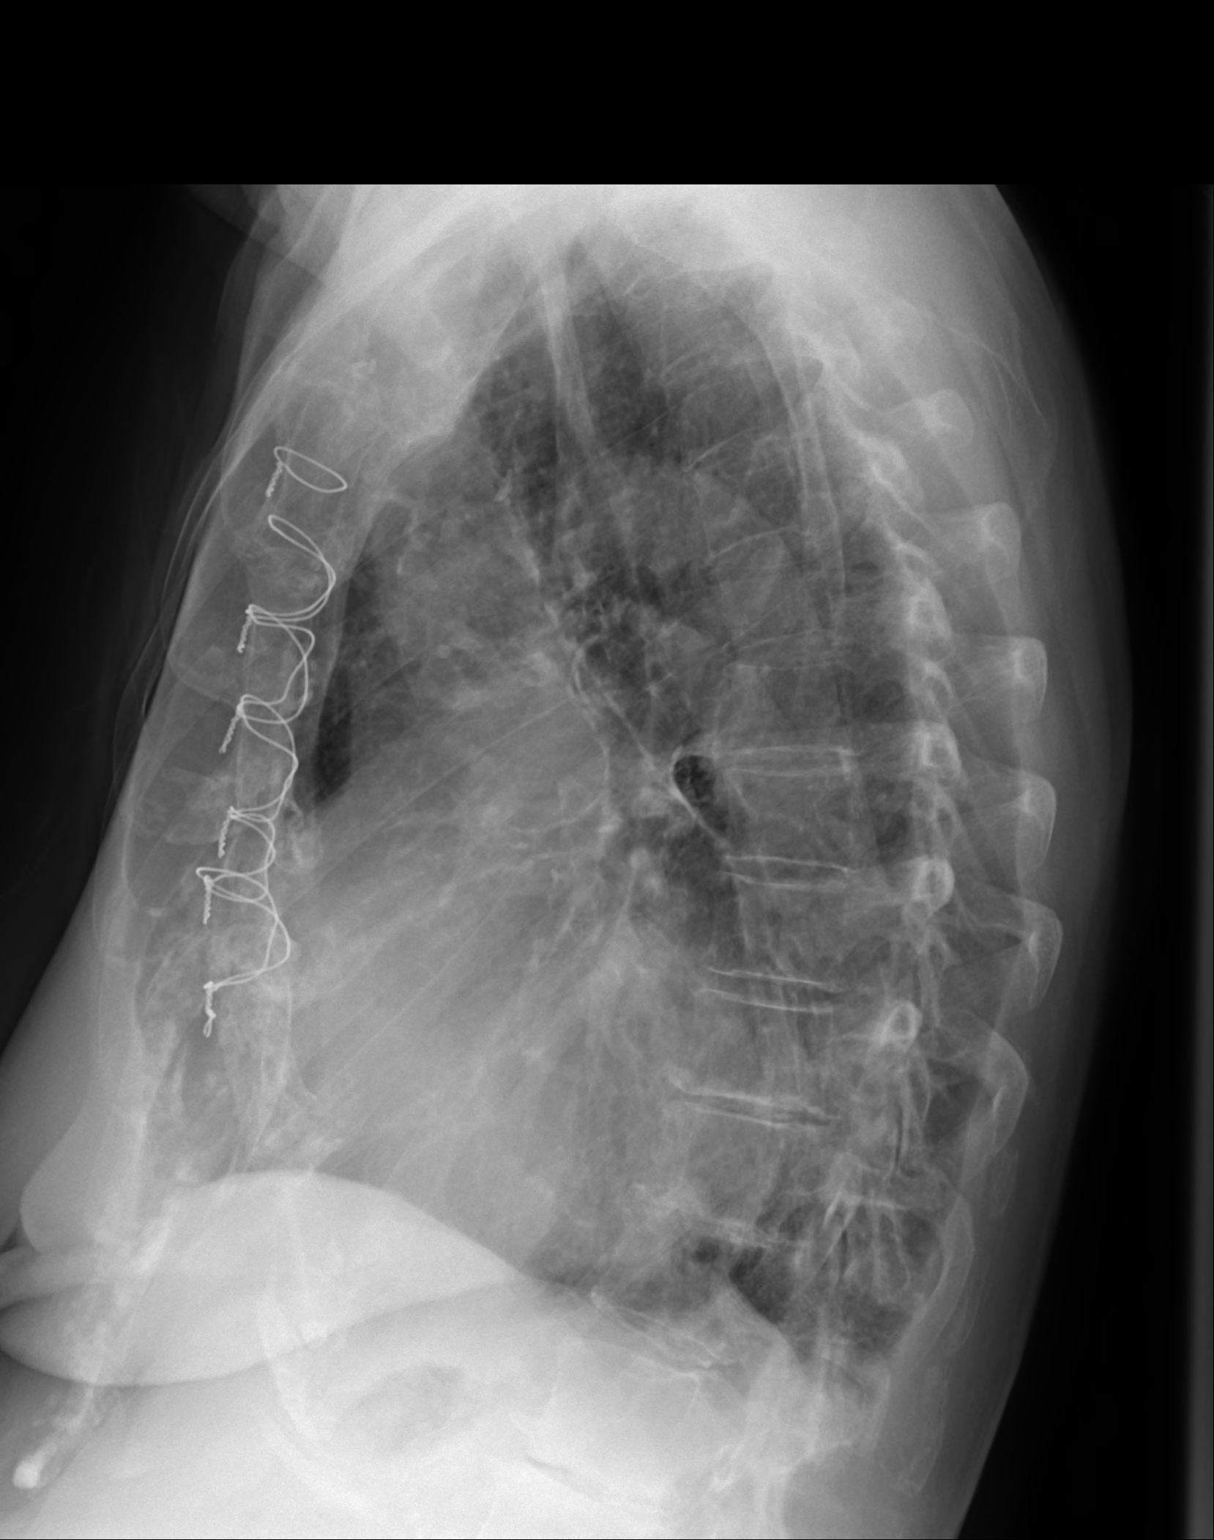

[2 of 2 positions shown; findings below may reference images not displayed]

FINDINGS: Patient has had median sternotomy. There is pectus excavatum. This is accentuated heart size but there is likely also moderate cardiomegaly. Small amount of density seen in the left lung base, this may be scar or trace amount of fluid. Upper lobes clear. No lobar pneumonia. No mediastinal widening. Aortic arch atherosclerotic.
IMPRESSION: 1. Small amount of parenchymal pleural density left base.

2. Cardiomegaly with median sternotomy. Pectus excavatum as well.

3. No lobar pneumonia seen.

## 2021-01-31 IMAGING — CT CTA HEAD/NECK WO/W CONTRAST
2 of 9 series · 10 of 33 positions shown · non-contrast
Comparison: 10/28/19

INDICATION: Carotid stenosis.
TECHNIQUE: CTA neck and head without and with 100 mL Lsovue-ZWL IV after i-STAT result [DATE] was sufficient for contrast administration. 3-D MIPS reconstructions reviewed. Postprocessing software generated rotating 3D MIP images, under concurrent physician supervision.

Total radiation dose to patient is CTDIvol 27.59 mGy and DLP 1018.49 mGy-cm.
Dose reduction technique used: Automated exposure control and adjustment of the mA and/or kV according to patient size. CT count in previous 12-months: 0

[Series 5: pre contrast · axial · non-contrast · 0.38mm/px · z∈[-938,-684]mm · 5 of 762 slices shown]
[im 127/762  soft-tissue]
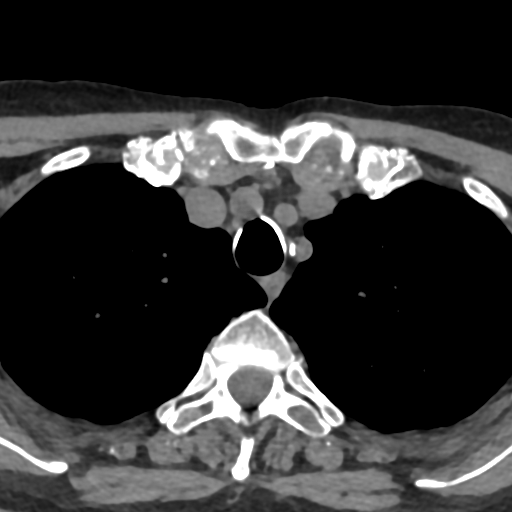
[im 254/762  bone]
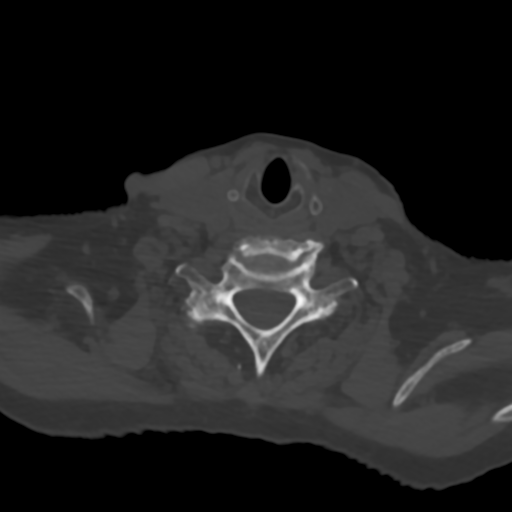
[im 381/762  soft-tissue]
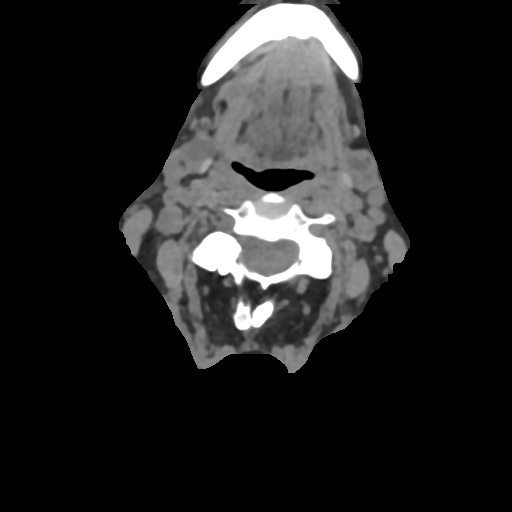
[im 508/762  bone]
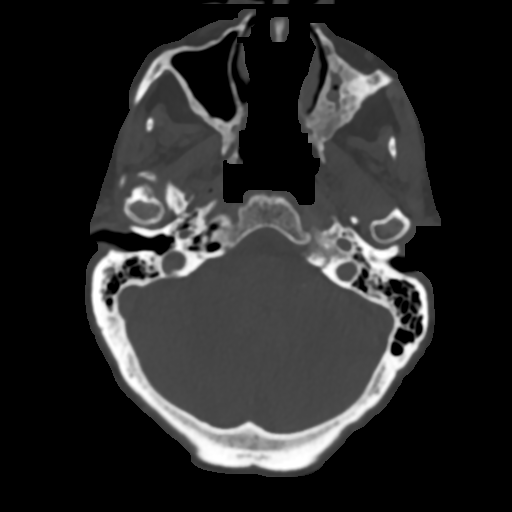
[im 635/762  soft-tissue]
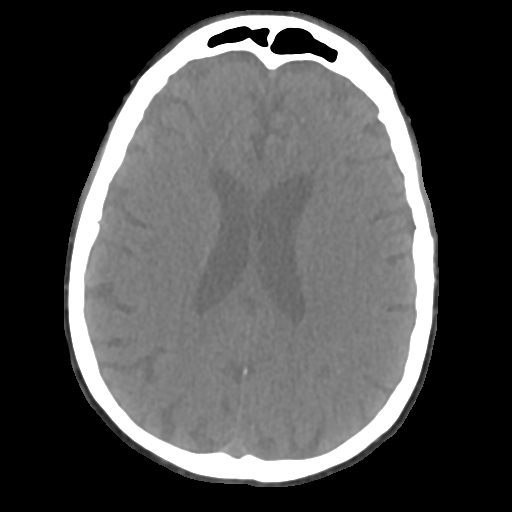

[Series 6: angio brain neck · axial · 0.38mm/px · z∈[-938,-684]mm · 5 of 762 slices shown]
[im 127/762  soft-tissue]
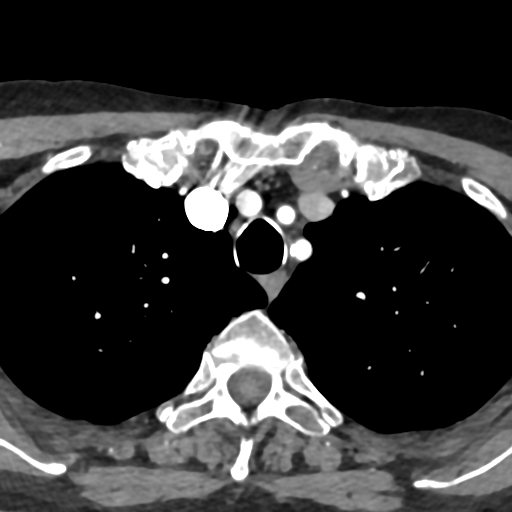
[im 254/762  soft-tissue]
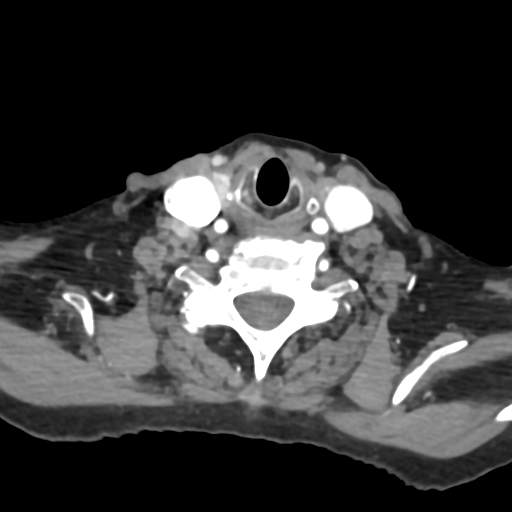
[im 381/762  soft-tissue]
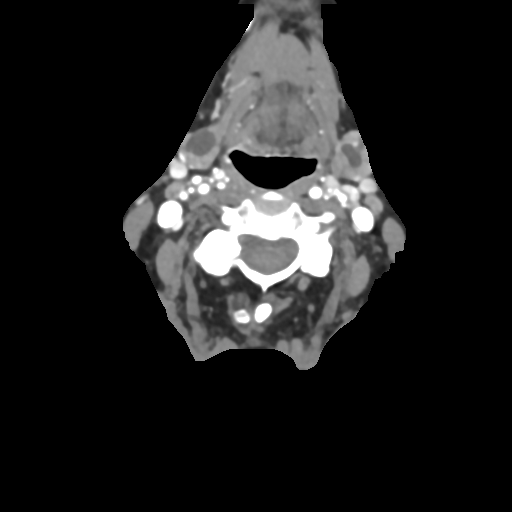
[im 508/762  soft-tissue]
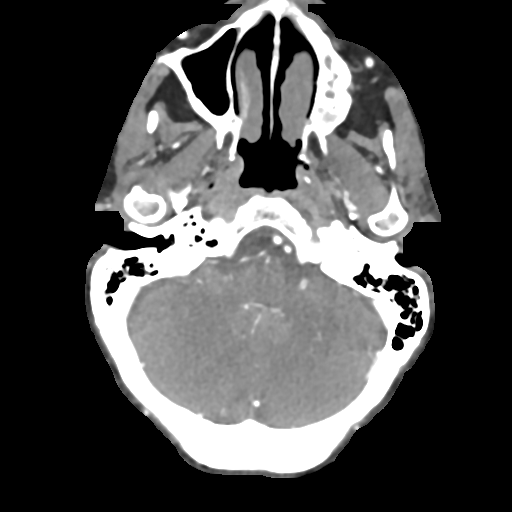
[im 635/762  soft-tissue]
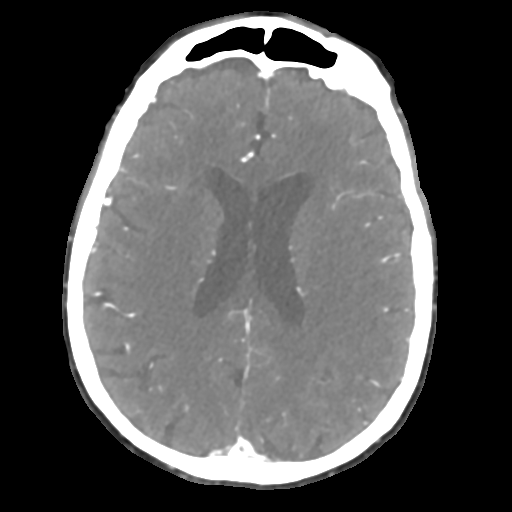

[10 of 33 positions shown; findings below may reference images not displayed]

FINDINGS: Precontrast: Right common to internal carotid stent noted. Severely calcified carotid siphon plaques bilaterally. Densely calcified arch and great vessel plaques. Sternotomy. Faint left parietal bone subcentimeter lucency likely benign given no change in a year. Hypoplastic left maxillary sinus with diffuse wall bony thickening and mild mucoperiosteal reaction with tiny air-fluid level about the same. Right antrectomy. Bilateral submandibular gland cystic areas.

Postcontrast: Arch and great vessels: Calcified plaquing although no hemodynamically significant stenosis.

Cervical carotids: Right carotid: The right carotid stent again has posterior stent wall distortion by a densely calcified carotid bulb plaque which narrows and deforms the stent, resulting in roughly 80% stenosis.

Left carotid: Dense left carotid bulb plaquing with resulting 50% stenosis, similar to prior.

Cervical vertebrals: Both cervical vertebrals are large in size and show no stenosis.

Intracranial circulation: At the skull base, bilaterally, the petrous portion of the carotid arteries, at the lower portions, are both congenitally very small in size yielding bilateral distal ICA narrowings, roughly 80-90% on the right, 70-80% on the left.

The right supraclinoid ICA has roughly 80% narrowing, similar to prior about 50-60%.

The posterior circulation looks unremarkable with patent bilateral PCom vessels as well as ACom anteriorly.
IMPRESSION: No significant change from last year's study. Multilevel hemodynamically significant stenoses per above.

## 2021-11-22 IMAGING — CR CHEST 1 VW
1 series · 1 of 1 positions shown · non-contrast
Comparison: 03/17/2020

Images Obtained from [facility]
HISTORY: Other long term (current) drug therapy
TECHNIQUE: A single view the chest is submitted.

[pa]
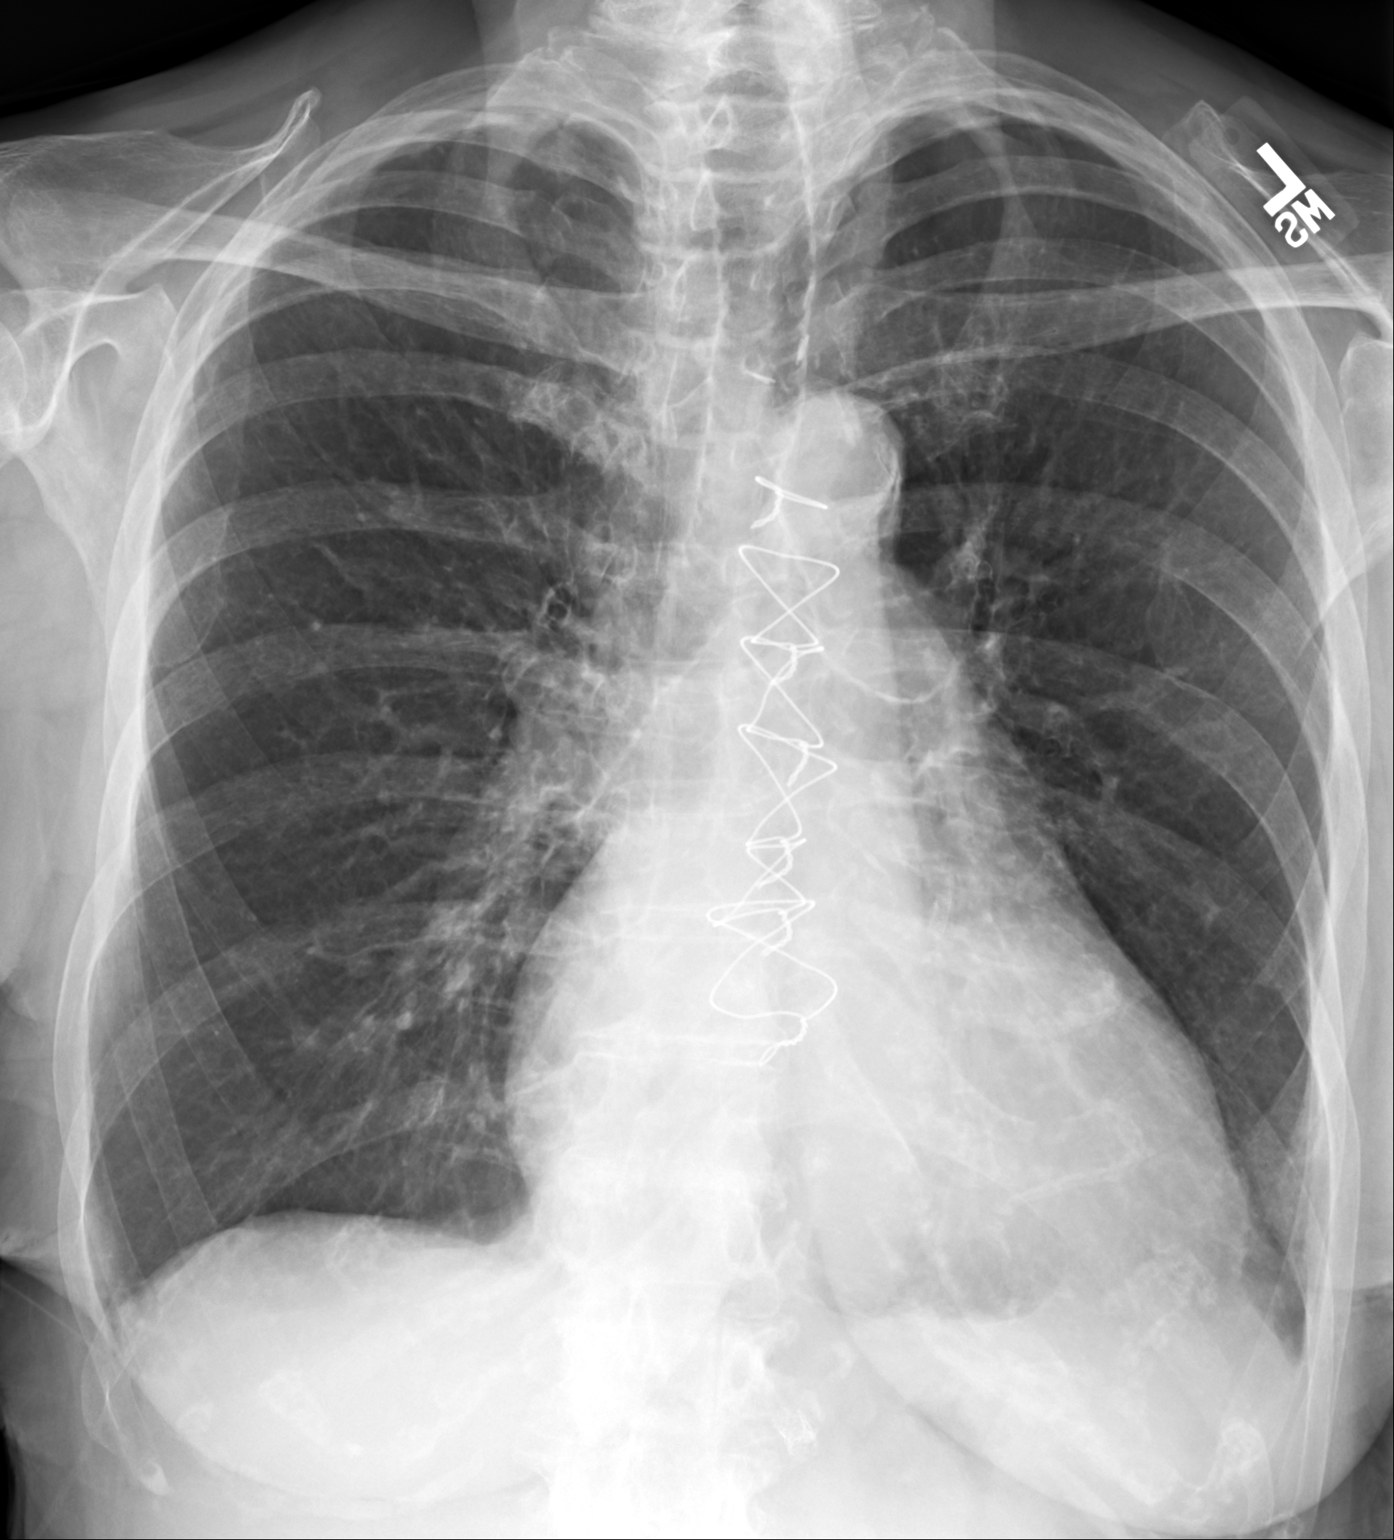

[1 of 1 positions shown; findings below may reference images not displayed]

FINDINGS: The patient has undergone previous median sternotomy. There is cardiomegaly with calcification about the aortic arch. There is a suspected trace amount of left pleural fluid with blunting
of the costophrenic sulcus. Appearance similar to the previous examination. No lobar or segmental infiltrate or edema is seen.
IMPRESSION: 1.  Status post previous median sternotomy.
2.  Cardiomegaly.
3.  Trace left pleural effusion.
4.  No acute findings.

## 2021-11-22 IMAGING — CR HAND RT 3 VWS MIN
1 series · 3 of 3 positions shown · non-contrast
Comparison: None.

Images Obtained from [facility]
REASON FOR EXAM: Pain in right hand.

[Series 1: pa · 0.17mm/px · 3 of 3 slices shown]
[im 1/3]
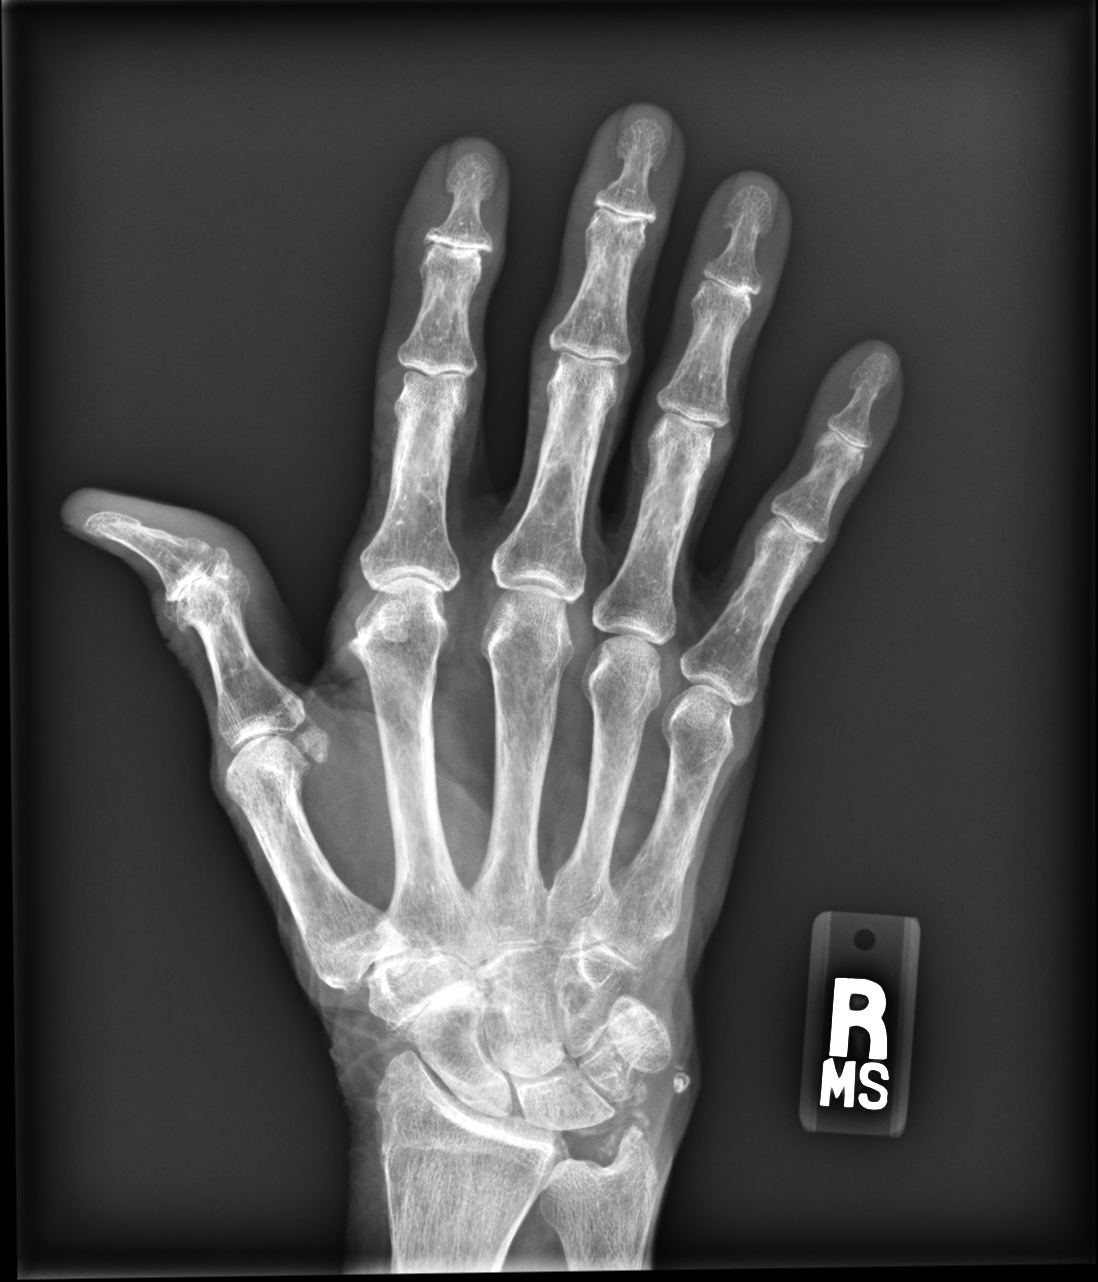
[im 2/3]
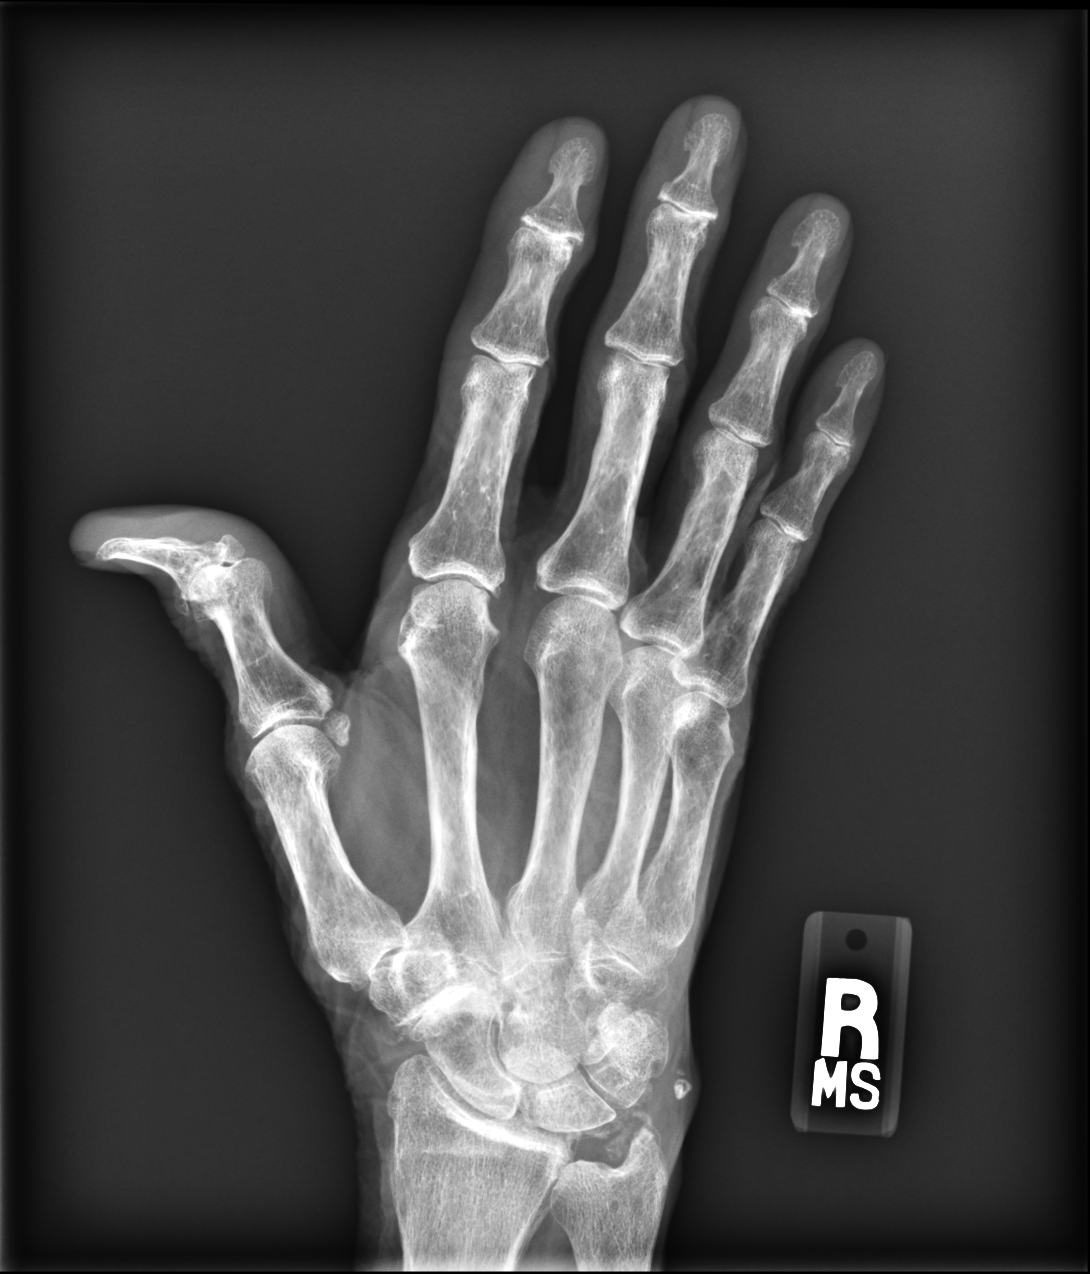
[im 3/3]
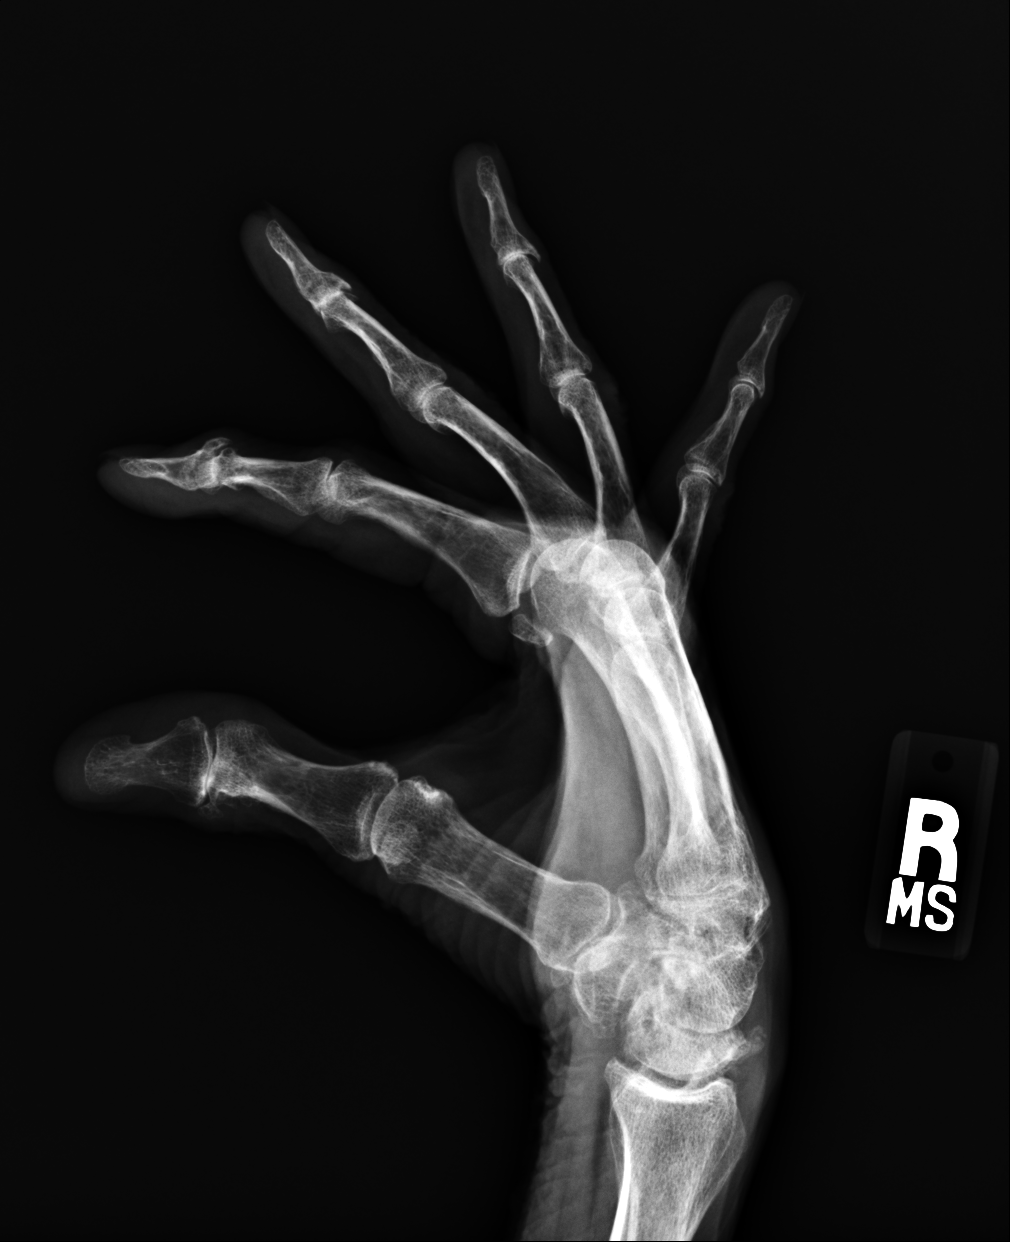

[3 of 3 positions shown; findings below may reference images not displayed]

FINDINGS: 3 views of the right hand show mild diffuse joint space narrowing involving the interphalangeal joints and the first and second MCP joint. Osteophyte formation is seen about the DIP
joints. Carpometacarpal joint space narrowing as well as some intercarpal joint space narrowing between the trapezium and scaphoid. Radiocarpal joint space narrowing seen. Calcification is seen
within the ulnocarpal joint.
IMPRESSION: 1.  Chondrocalcinosis.
2.  Extensive osteoarthritis.

## 2021-11-22 IMAGING — CR HAND LT 3 VWS MIN
1 series · 3 of 3 positions shown · non-contrast
Comparison: None

Images Obtained from [facility]
REASON FOR EXAM: Pain in left hand

[Series 1: pa · 0.17mm/px · 3 of 3 slices shown]
[im 1/3]
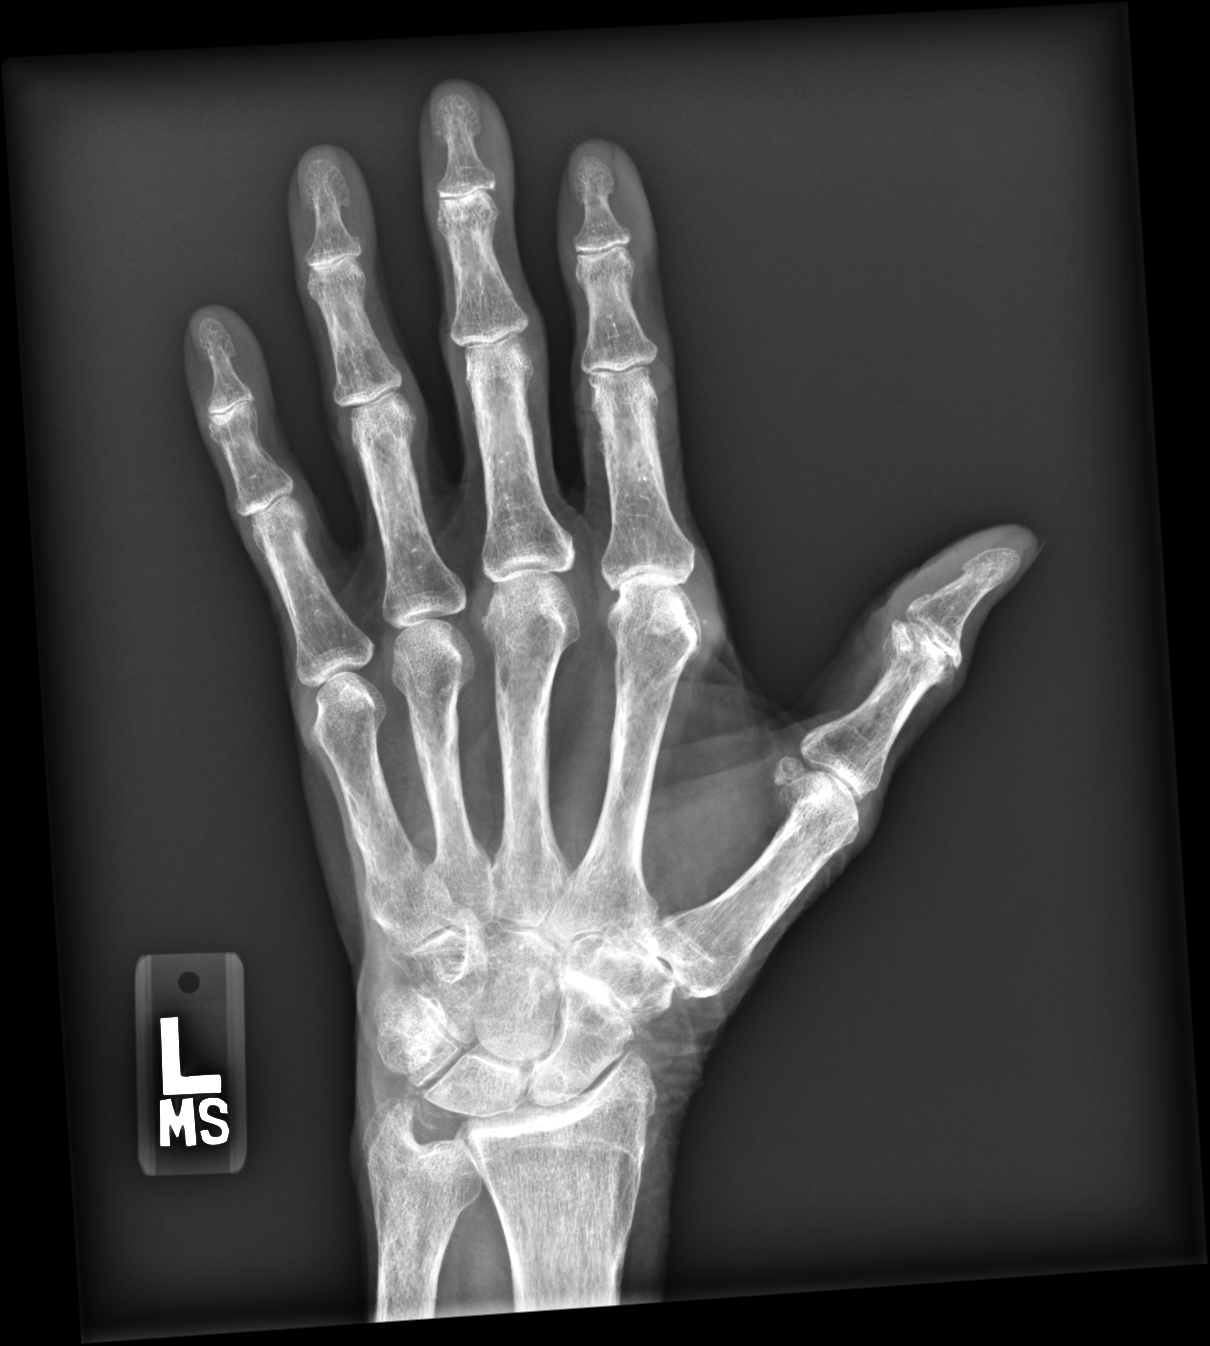
[im 2/3]
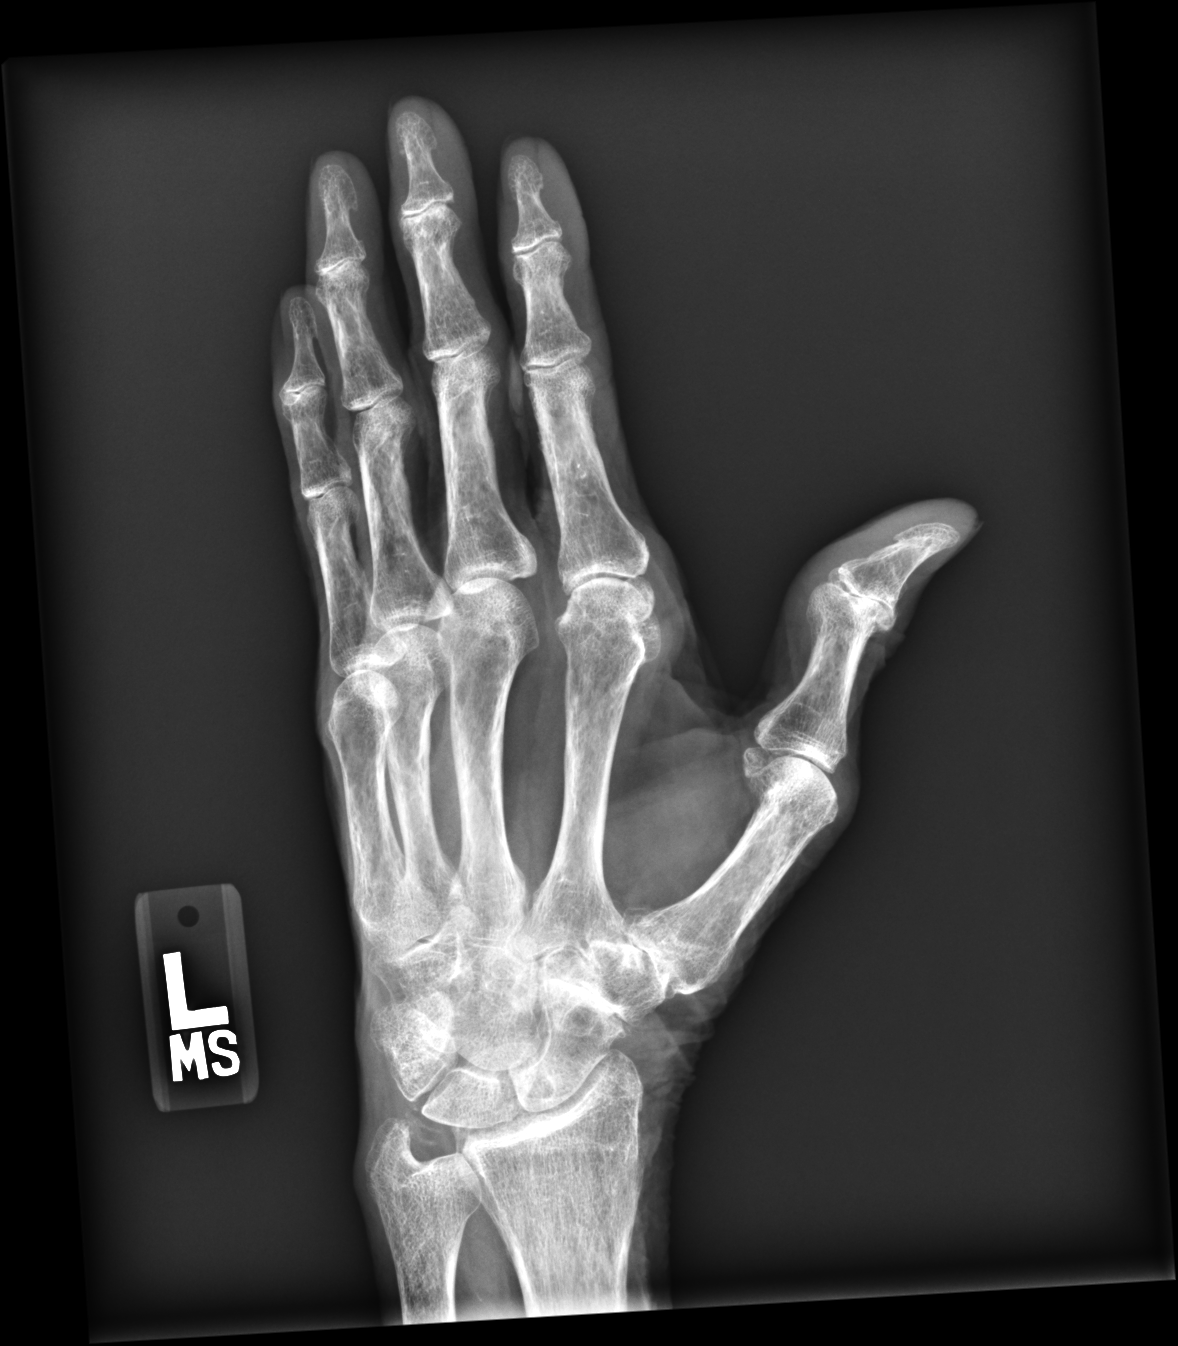
[im 3/3]
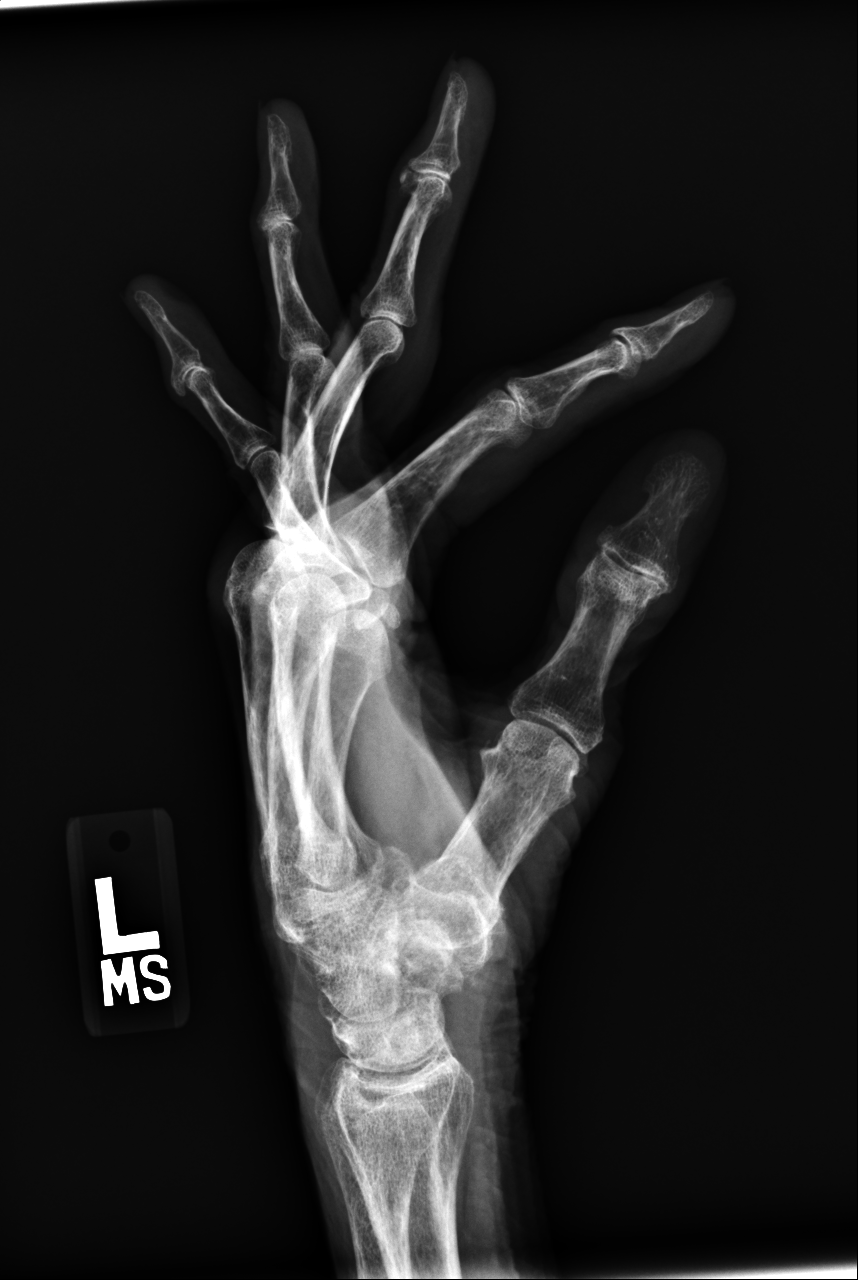

[3 of 3 positions shown; findings below may reference images not displayed]

FINDINGS: 3 views of the left hand show osteopenic bones. There is joint space narrowing about the interphalangeal joints with osteophyte formation seen about the DIP joints. Joint narrowing is seen
of the second MCP joint, CMC joints and between the scaphoid and trapezium. Stippled calcification is seen within the ulnocarpal joint space. Lucencies are seen on either side of the scaphoid typical
for subchondral cysts.
IMPRESSION: 1. Chronic changes with chondrocalcinosis.
2. Arthritic changes in the areas described above.

## 2022-02-28 IMAGING — MR MRI TSPINE WO CONTRAST
4 of 6 series · 20 of 48 positions shown · non-contrast
Comparison: None.

Images Obtained from [facility]
REASON FOR EXAM: Pain in thoracic spine, Other chronic pain
TECHNIQUE: Multiplanar, multisequence MR images of the thoracic spine were obtained without intravenous contrast.

[Series 5: t2_sag · sagittal · 4.0mm · 0.78mm/px · 5 of 13 slices shown]
[im 1/13]
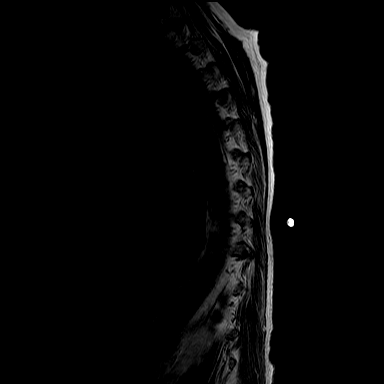
[im 4/13]
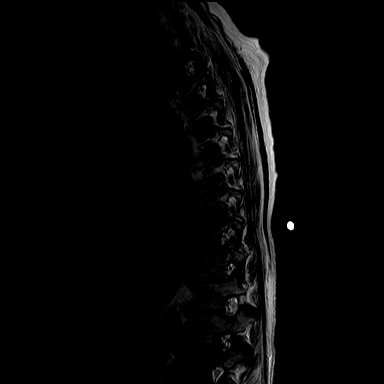
[im 7/13]
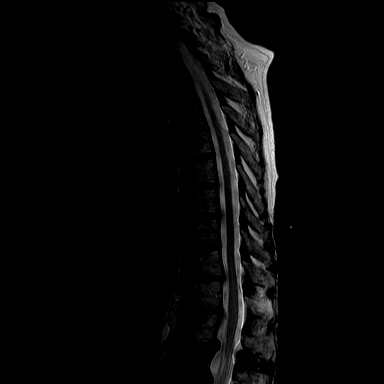
[im 10/13]
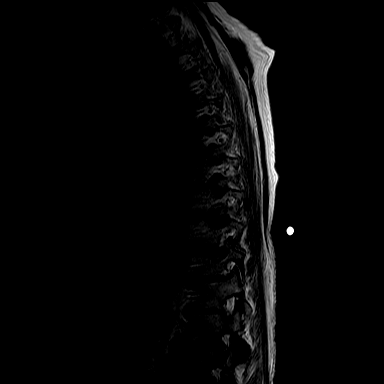
[im 13/13]
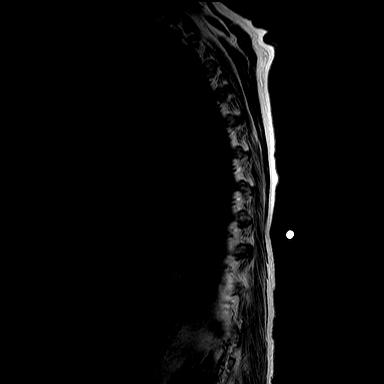

[Series 6: t1_sag · sagittal · 4.0mm · 0.94mm/px · 5 of 13 slices shown]
[im 1/13]
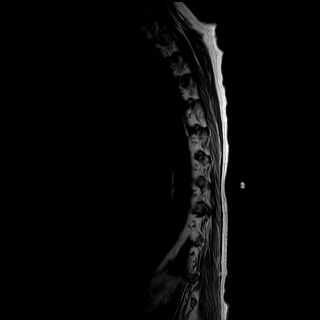
[im 4/13]
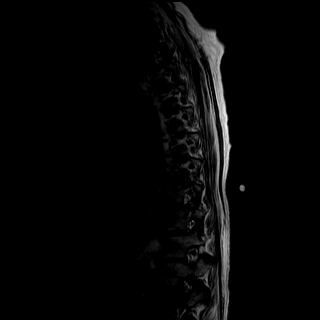
[im 7/13]
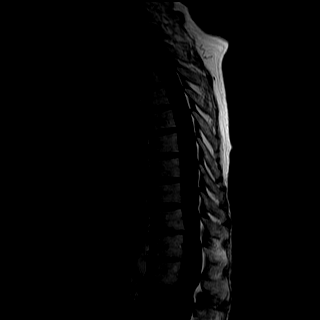
[im 10/13]
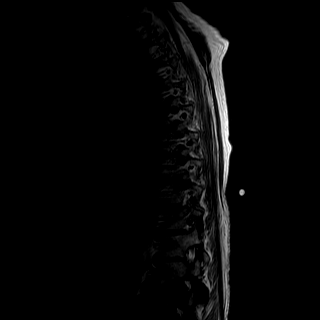
[im 13/13]
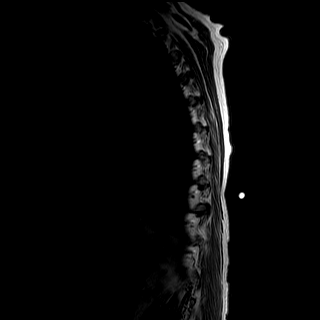

[Series 7: ir_sag · sagittal · 4.0mm · 0.59mm/px · 5 of 13 slices shown]
[im 1/13]
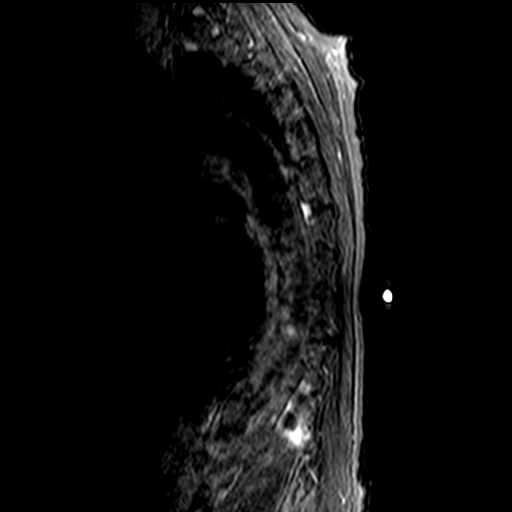
[im 4/13]
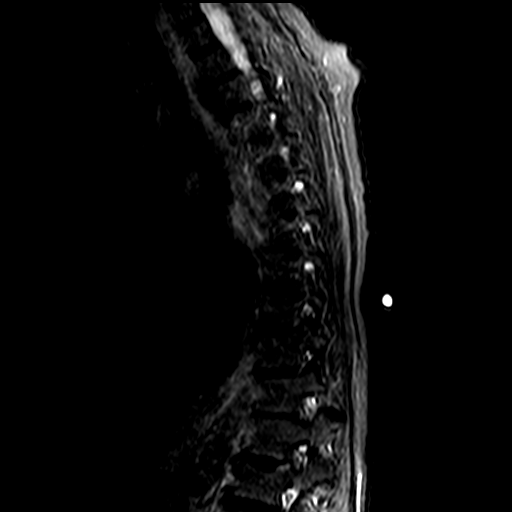
[im 7/13]
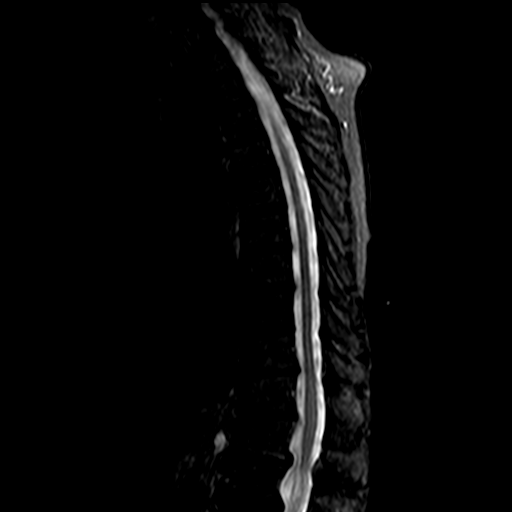
[im 10/13]
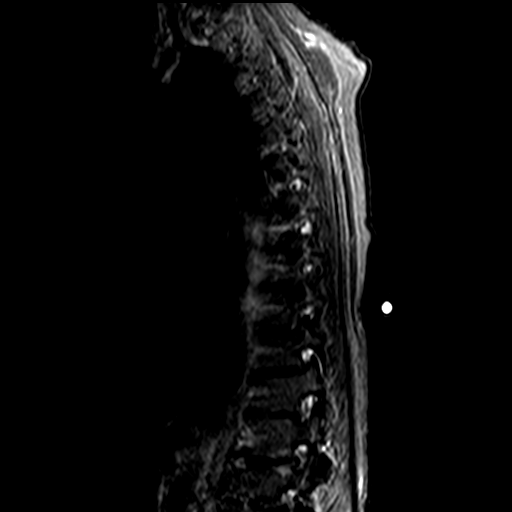
[im 13/13]
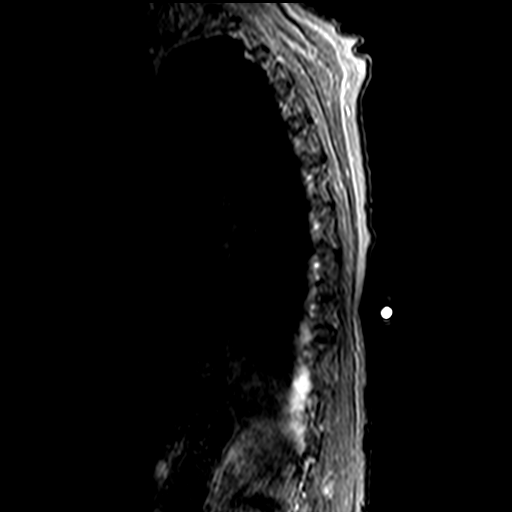

[Series 8: t2_axial_upr · axial · 4.0mm · 0.28mm/px · z∈[+4,+122]mm · 5 of 31 slices shown]
[im 1/31]
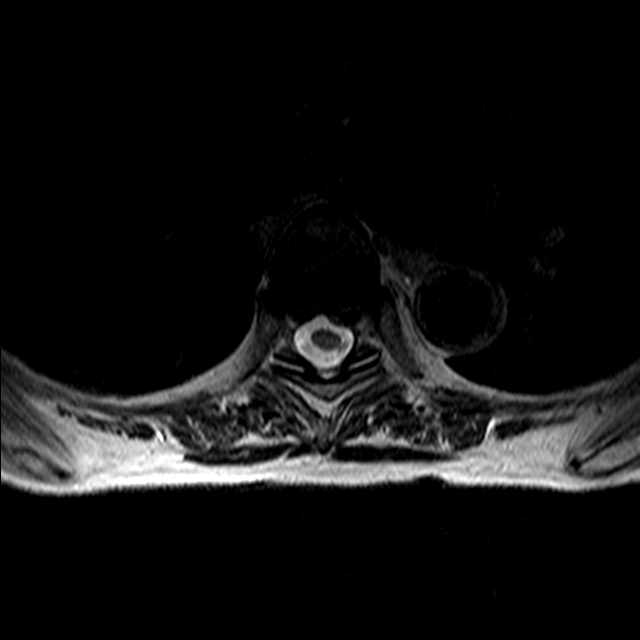
[im 6/31]
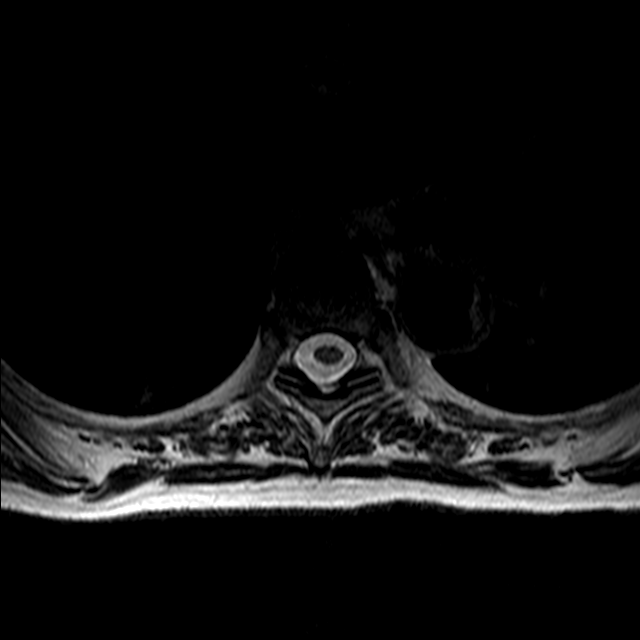
[im 11/31]
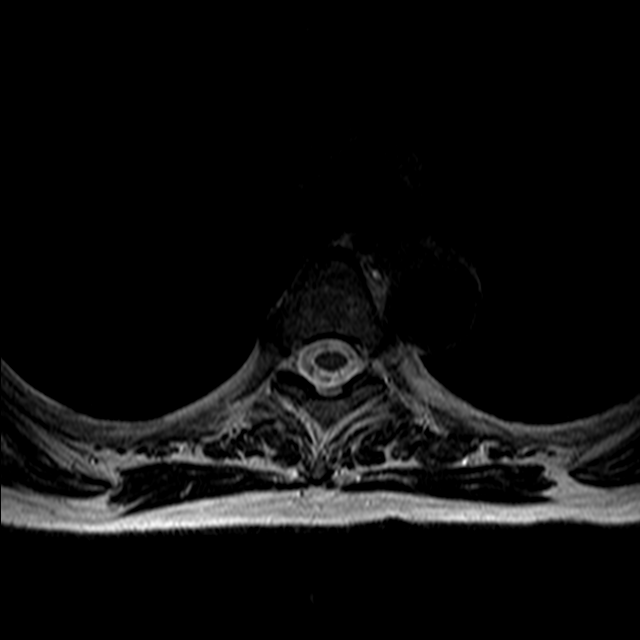
[im 16/31]
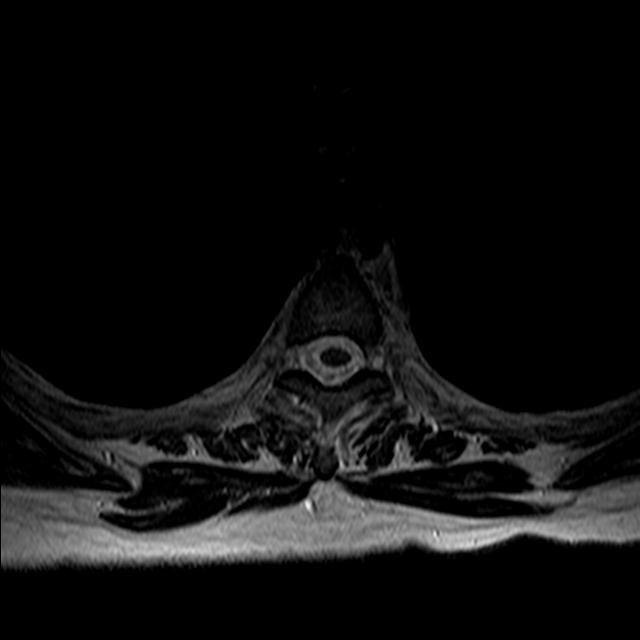
[im 26/31]
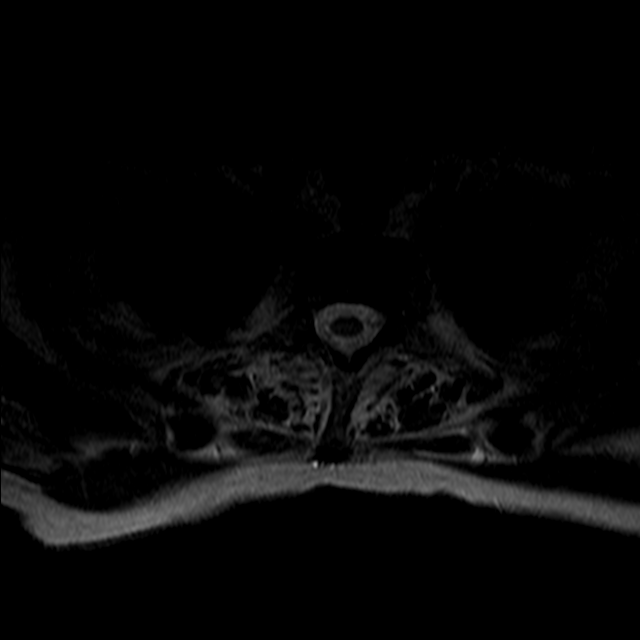

[20 of 48 positions shown; findings below may reference images not displayed]

FINDINGS: COUNT/LABELING: Please note that spinal labeling was performed assuming there are 12 rib-bearing thoracic vertebrae.
ALIGNMENT: No subluxations.
VERTEBRAE:  Vertebral body heights are maintained. T1 and T2 hypointense lesion in the T6 vertebral body is indeterminate. Multilevel degenerative signal changes.
INTERVERTEBRAL DISCS:  Multiple disc desiccation with mild height loss at T10-11 and moderate to severe height loss at T11-12. Significant disc height loss and irregularity of the endplates at T12-L[DATE] be degenerative in etiology or related to infection.
PARASPINAL SOFT TISSUES: Mild paravertebral edema at T12-L1.
SPINAL CORD: The thoracic spinal cord is normal color and signal.
FINDINGS BY LEVEL:
T1-2: No high-grade canal stenosis or foraminal narrowing.
T2-3: No high-grade canal stenosis or foraminal narrowing.
T3-4: No high-grade canal stenosis or foraminal narrowing.
T4-5: No high-grade canal stenosis or foraminal narrowing.
T5-6: No high-grade canal stenosis or foraminal narrowing.
T6-7: No high-grade canal stenosis or foraminal narrowing.
T7-8: Small left paracentral disc protrusion causes mild canal stenosis without mass effect on the spinal cord. No high-grade foraminal narrowing.
T8-9: Small left paracentral disc protrusion causes mild canal stenosis without mass effect on the spinal cord. No high-grade foraminal narrowing.
T9-10: No high-grade canal stenosis or foraminal narrowing.
T10-11: Small right paracentral disc protrusion causes mild canal stenosis without mass effect on the spinal cord. No high-grade foraminal narrowing.
T11-12: Small left paracentral disc extrusion with inferior migration causing mild canal stenosis without mass effect on the spinal cord. No high-grade foraminal narrowing.
T12-L1: No high-grade canal stenosis or foraminal narrowing.
OTHER:
T2 hyperintense lesion associated with the left kidneys not fully evaluated but statistically most likely to represent a cyst.
IMPRESSION: 1.  Significant disc height loss and irregularity of the endplates at T12-L[DATE] be degenerative in etiology or related to infection. Lumbar spine MRI without and with contrast may be helpful for
further evaluation. There is mild paravertebral edema at this level. Correlate with signs of infection.
2.  T1 and T2 hypointense lesion in the T6 vertebral body is indeterminate. Thoracic spine MRI with contrast may be helpful for further characterization.
3.  Multilevel spondylotic changes without high-grade canal stenosis or foraminal narrowing.

## 2022-03-26 IMAGING — MR MRI TSPINE WO/W CONTRAST
6 of 8 series · 24 of 48 positions shown · IV contrast (prohance)
Comparison: 02/28/22

Images Obtained from [facility]
HISTORY: 86 years-old Female with abnormal findings on diagnostic imaging of other specified body structures, low back pain, unspecified.
TECHNIQUE: MRI study of the thoracic spine was performed using sagittal and axial images of varying sequences. The examination is performed first without contrast followed by IV contrast.
IV contrast: 15 mL ProHance.

[Series 4: t2_sag · sagittal · 4.0mm · 0.91mm/px · 2 of 13 slices shown]
[im 1/13]
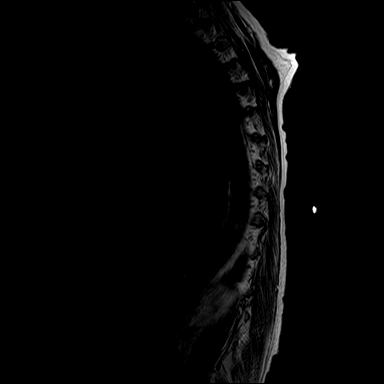
[im 13/13]
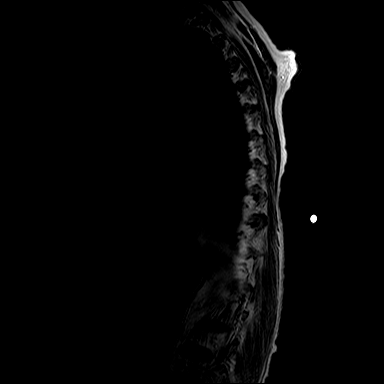

[Series 5: t1_sag · sagittal · 4.0mm · 1.09mm/px · 2 of 13 slices shown]
[im 1/13]
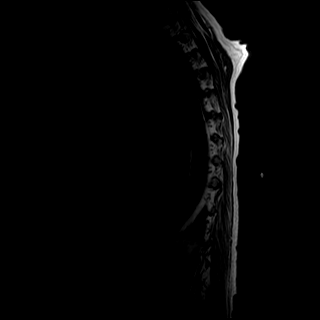
[im 13/13]
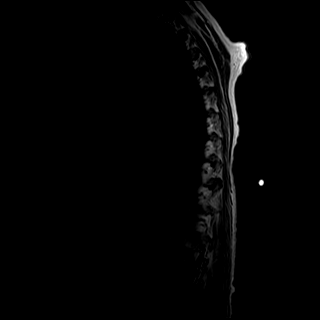

[Series 6: ir_sag · sagittal · 4.0mm · 0.68mm/px · 2 of 13 slices shown]
[im 1/13]
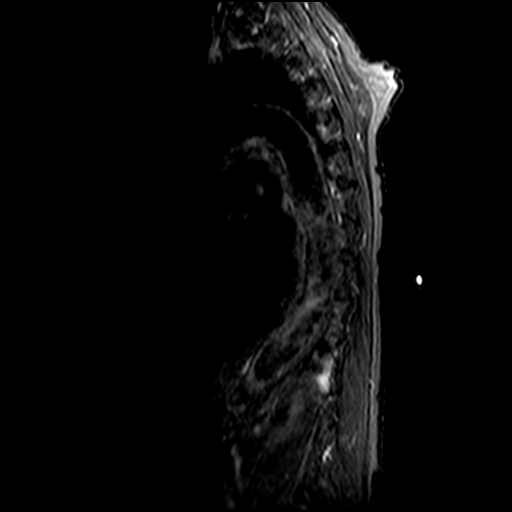
[im 13/13]
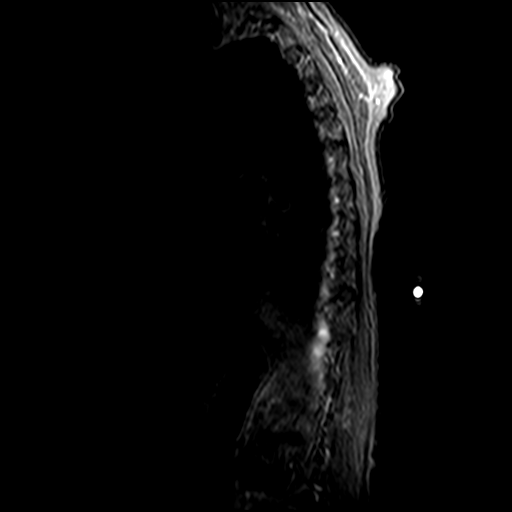

[Series 10: t2_axial · axial · 4.0mm · 0.28mm/px · z∈[-200,+114]mm · 8 of 68 slices shown]
[im 1/68]
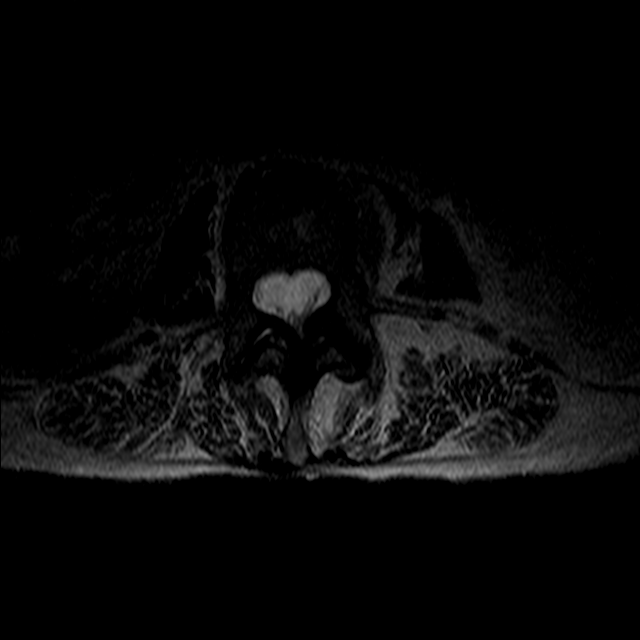
[im 8/68]
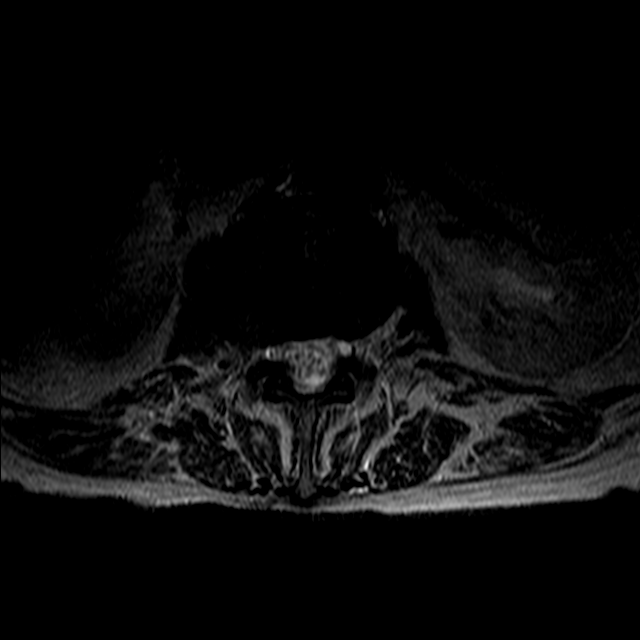
[im 23/68]
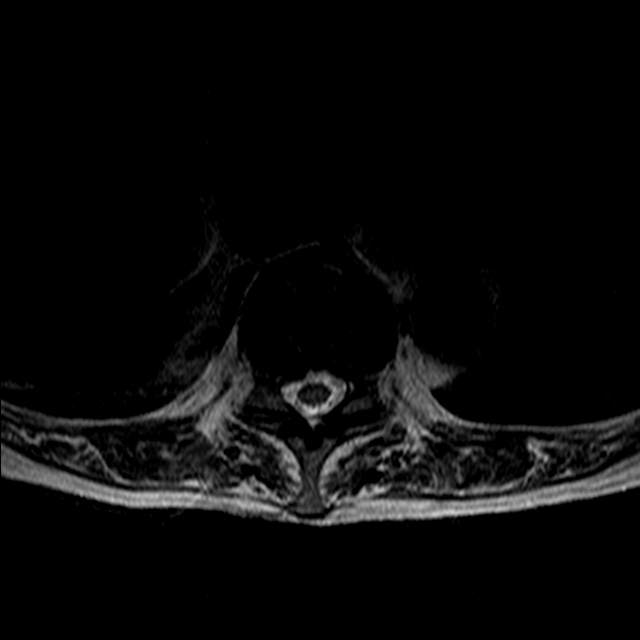
[im 30/68]
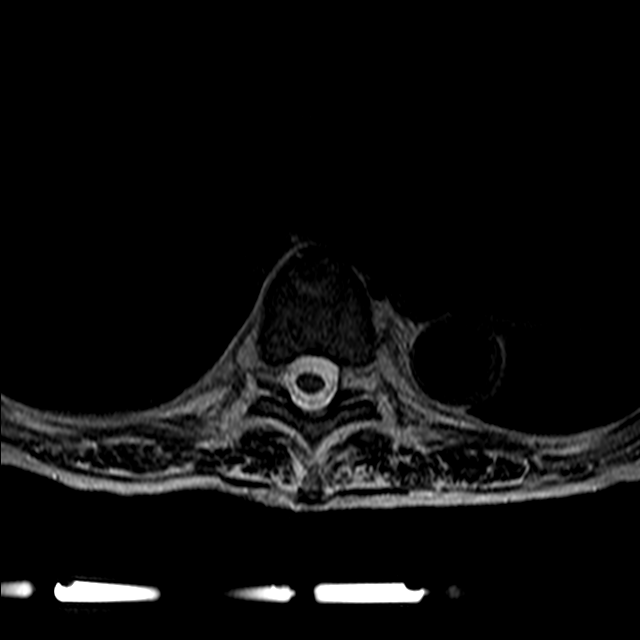
[im 38/68]
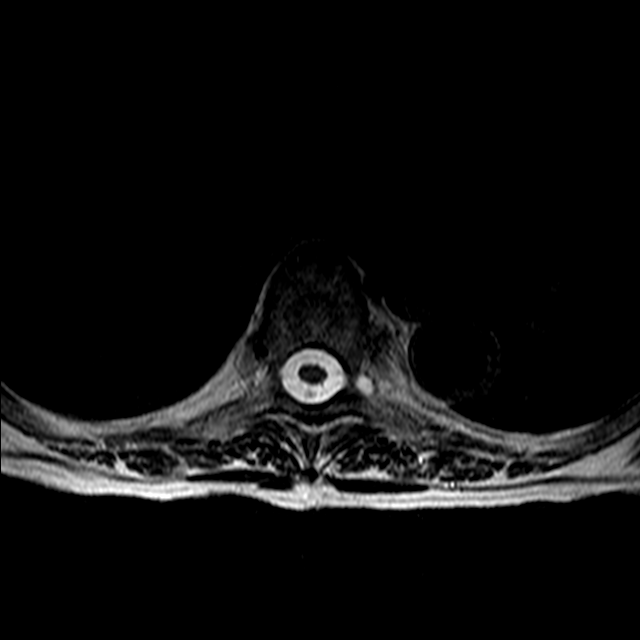
[im 45/68]
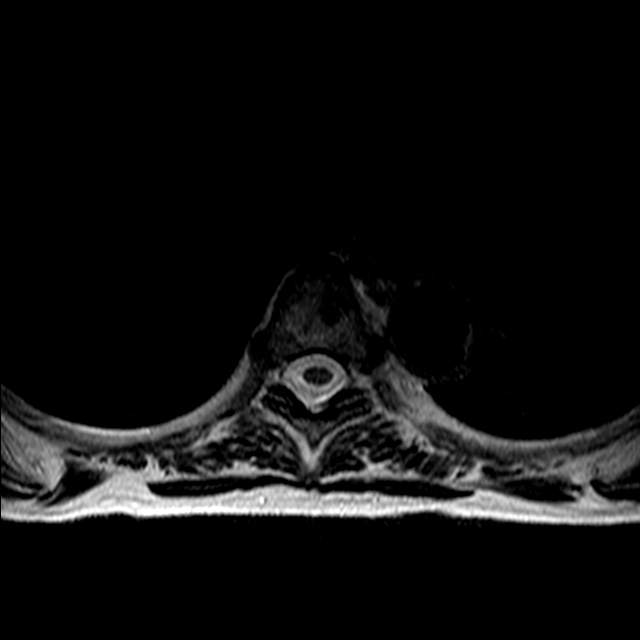
[im 60/68]
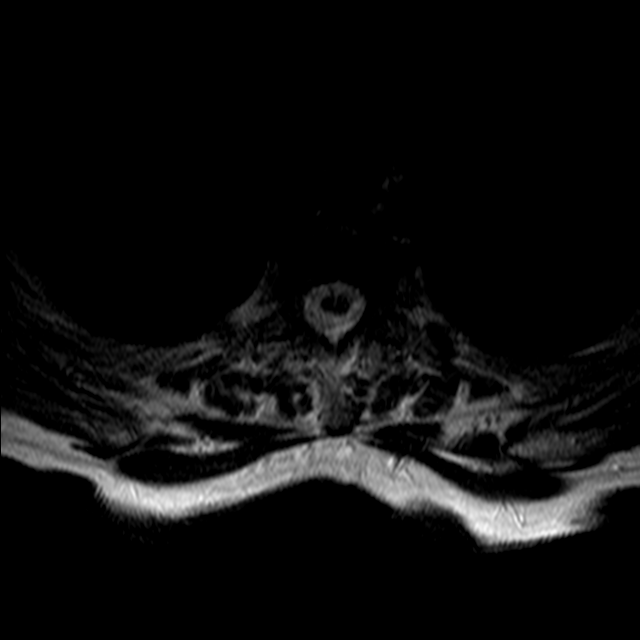
[im 68/68]
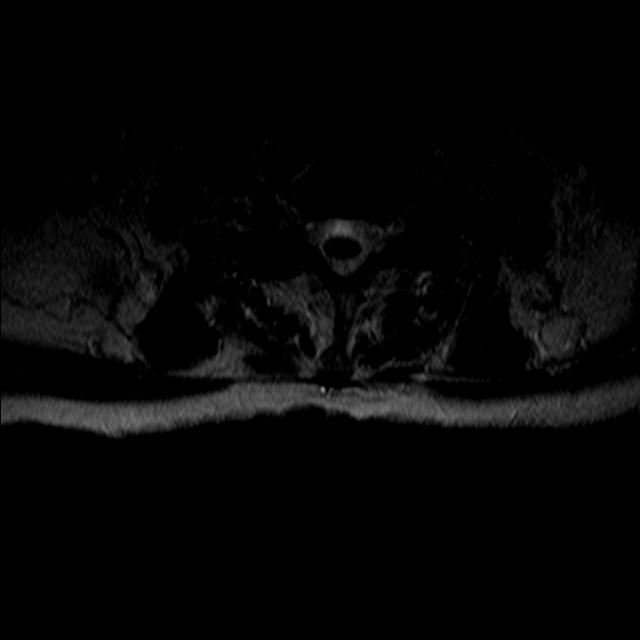

[Series 12: t1_axial_fs_pre · axial · 4.0mm · 0.78mm/px · z∈[-204,+113]mm · 8 of 70 slices shown]
[im 1/70]
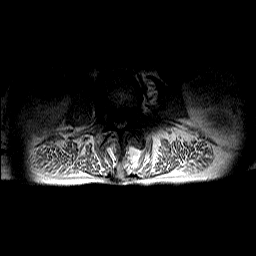
[im 8/70]
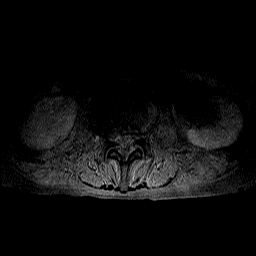
[im 24/70]
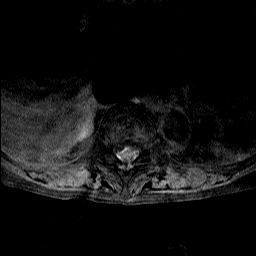
[im 31/70]
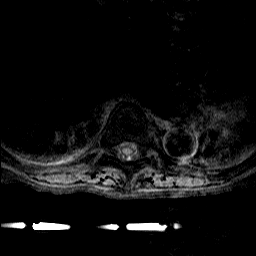
[im 39/70]
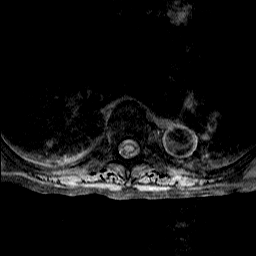
[im 47/70]
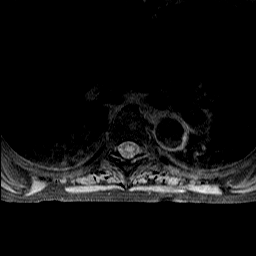
[im 62/70]
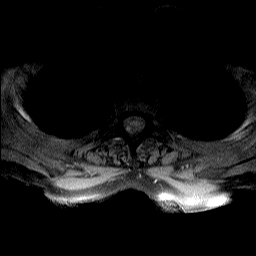
[im 70/70]
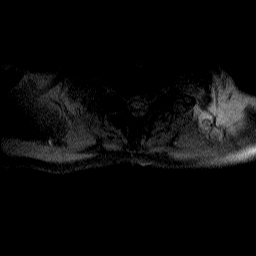

[Series 15: t1_axial_fs_+c · axial · 4.0mm · 0.78mm/px · z∈[-204,-169]mm · 2 of 70 slices shown]
[im 1/70]
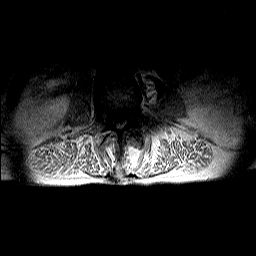
[im 8/70]
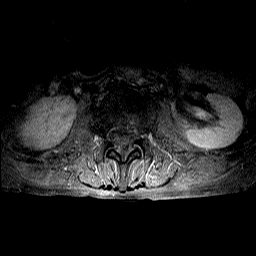

[24 of 48 positions shown; findings below may reference images not displayed]

FINDINGS: Vertebral body height and alignment: Within normal limits.
Marrow signal: The marrow signal is unremarkable.
Thoracic cord: Within normal limits.
T1-2: The spinal canal and the neural foramina are patent.
T2-3: The spinal canal and the neural foramina are patent.
T3-4: The spinal canal and the neural foramina are patent.
T4-5: The spinal canal and the neural foramina are patent.
T5-6: The spinal canal and the neural foramina are patent.
T6-7: The spinal canal and the neural foramina are patent.
T7-8: The spinal canal and the neural foramina are patent.
T8-9: The spinal canal and the neural foramina are patent.
T9-10: The spinal canal and the neural foramina are patent.
T10-11: The far right lateral broad-based disc protrusion again noted slightly effacing the thecal sac and causing moderate foraminal narrowing although only mild spinal stenosis when combined with
small posterior bars. 2 mm retrolisthesis of T10 behind 11.
T11-12: Left paramedian broad-based disc protrusion looks similar to prior, effacing the thecal sac and yielding very mild spinal stenosis when combined with small posterior bars.
T12-L1: L1 superior endplate irregularities with very mild subendplate high signal on IR although no significant increase signal within the disc space itself nor paraspinous tissues. Post gadolinium,
there is mild enhancement in the periphery of the disc space which I feel is most consistent with Modic change rather than a low-grade discitis. There are also areas of prominent lumbar vein
enhancement forming the hemiazygos.
Incidental note made of moderate looking L1-2 and L2-3 spinal stenoses.
IMPRESSION: No adverse change from prior. Postcontrast study consistent with T12-L1 Modic change rather than discitis.
